# Patient Record
Sex: Female | Born: 1975 | Race: White | Hispanic: No | Marital: Married | Smoking: Current every day smoker
Health system: Southern US, Community
[De-identification: ages and names within clinical notes are randomized; demographics above are authoritative.]

## PROBLEM LIST (undated history)

## (undated) DIAGNOSIS — IMO0002 Reserved for concepts with insufficient information to code with codable children: Secondary | ICD-10-CM

## (undated) DIAGNOSIS — R51 Headache: Secondary | ICD-10-CM

## (undated) DIAGNOSIS — F1910 Other psychoactive substance abuse, uncomplicated: Secondary | ICD-10-CM

## (undated) DIAGNOSIS — J439 Emphysema, unspecified: Secondary | ICD-10-CM

## (undated) DIAGNOSIS — F329 Major depressive disorder, single episode, unspecified: Secondary | ICD-10-CM

## (undated) DIAGNOSIS — F32A Depression, unspecified: Secondary | ICD-10-CM

## (undated) DIAGNOSIS — I10 Essential (primary) hypertension: Secondary | ICD-10-CM

## (undated) DIAGNOSIS — K219 Gastro-esophageal reflux disease without esophagitis: Secondary | ICD-10-CM

## (undated) HISTORY — DX: Reserved for concepts with insufficient information to code with codable children: IMO0002

## (undated) HISTORY — DX: Major depressive disorder, single episode, unspecified: F32.9

## (undated) HISTORY — DX: Essential (primary) hypertension: I10

## (undated) HISTORY — DX: Gastro-esophageal reflux disease without esophagitis: K21.9

## (undated) HISTORY — PX: CHOLECYSTECTOMY: SHX55

## (undated) HISTORY — DX: Depression, unspecified: F32.A

## (undated) HISTORY — DX: Headache: R51

## (undated) HISTORY — DX: Emphysema, unspecified: J43.9

## (undated) HISTORY — DX: Other psychoactive substance abuse, uncomplicated: F19.10

## (undated) HISTORY — PX: TUBAL LIGATION: SHX77

---

## 2002-11-28 ENCOUNTER — Emergency Department (HOSPITAL_COMMUNITY): Admission: EM | Admit: 2002-11-28 | Discharge: 2002-11-28 | Payer: Self-pay | Admitting: Emergency Medicine

## 2003-11-02 ENCOUNTER — Emergency Department (HOSPITAL_COMMUNITY): Admission: AD | Admit: 2003-11-02 | Discharge: 2003-11-02 | Payer: Self-pay | Admitting: Emergency Medicine

## 2006-11-02 ENCOUNTER — Emergency Department (HOSPITAL_COMMUNITY): Admission: EM | Admit: 2006-11-02 | Discharge: 2006-11-03 | Payer: Self-pay | Admitting: Emergency Medicine

## 2008-10-29 ENCOUNTER — Emergency Department (HOSPITAL_COMMUNITY): Admission: EM | Admit: 2008-10-29 | Discharge: 2008-10-29 | Payer: Self-pay | Admitting: Emergency Medicine

## 2009-02-18 ENCOUNTER — Emergency Department (HOSPITAL_COMMUNITY): Admission: EM | Admit: 2009-02-18 | Discharge: 2009-02-19 | Payer: Self-pay | Admitting: Emergency Medicine

## 2009-03-18 ENCOUNTER — Emergency Department (HOSPITAL_COMMUNITY): Admission: EM | Admit: 2009-03-18 | Discharge: 2009-03-18 | Payer: Self-pay | Admitting: Emergency Medicine

## 2009-05-29 ENCOUNTER — Emergency Department (HOSPITAL_COMMUNITY): Admission: EM | Admit: 2009-05-29 | Discharge: 2009-05-29 | Payer: Self-pay | Admitting: Emergency Medicine

## 2009-11-14 ENCOUNTER — Ambulatory Visit: Payer: Self-pay | Admitting: Family Medicine

## 2010-03-03 ENCOUNTER — Emergency Department (HOSPITAL_COMMUNITY): Admission: EM | Admit: 2010-03-03 | Discharge: 2010-03-03 | Payer: Self-pay | Admitting: Emergency Medicine

## 2010-07-01 ENCOUNTER — Emergency Department (HOSPITAL_COMMUNITY)
Admission: EM | Admit: 2010-07-01 | Discharge: 2010-07-01 | Payer: Self-pay | Source: Home / Self Care | Admitting: Emergency Medicine

## 2010-08-29 IMAGING — CR DG ABDOMEN 1V
1 series · 1 of 1 positions shown · non-contrast
Comparison: None

CLINICAL DATA: Epigastric pain with nausea and vomiting

ABDOMEN - 1 VIEW

[view not recorded]
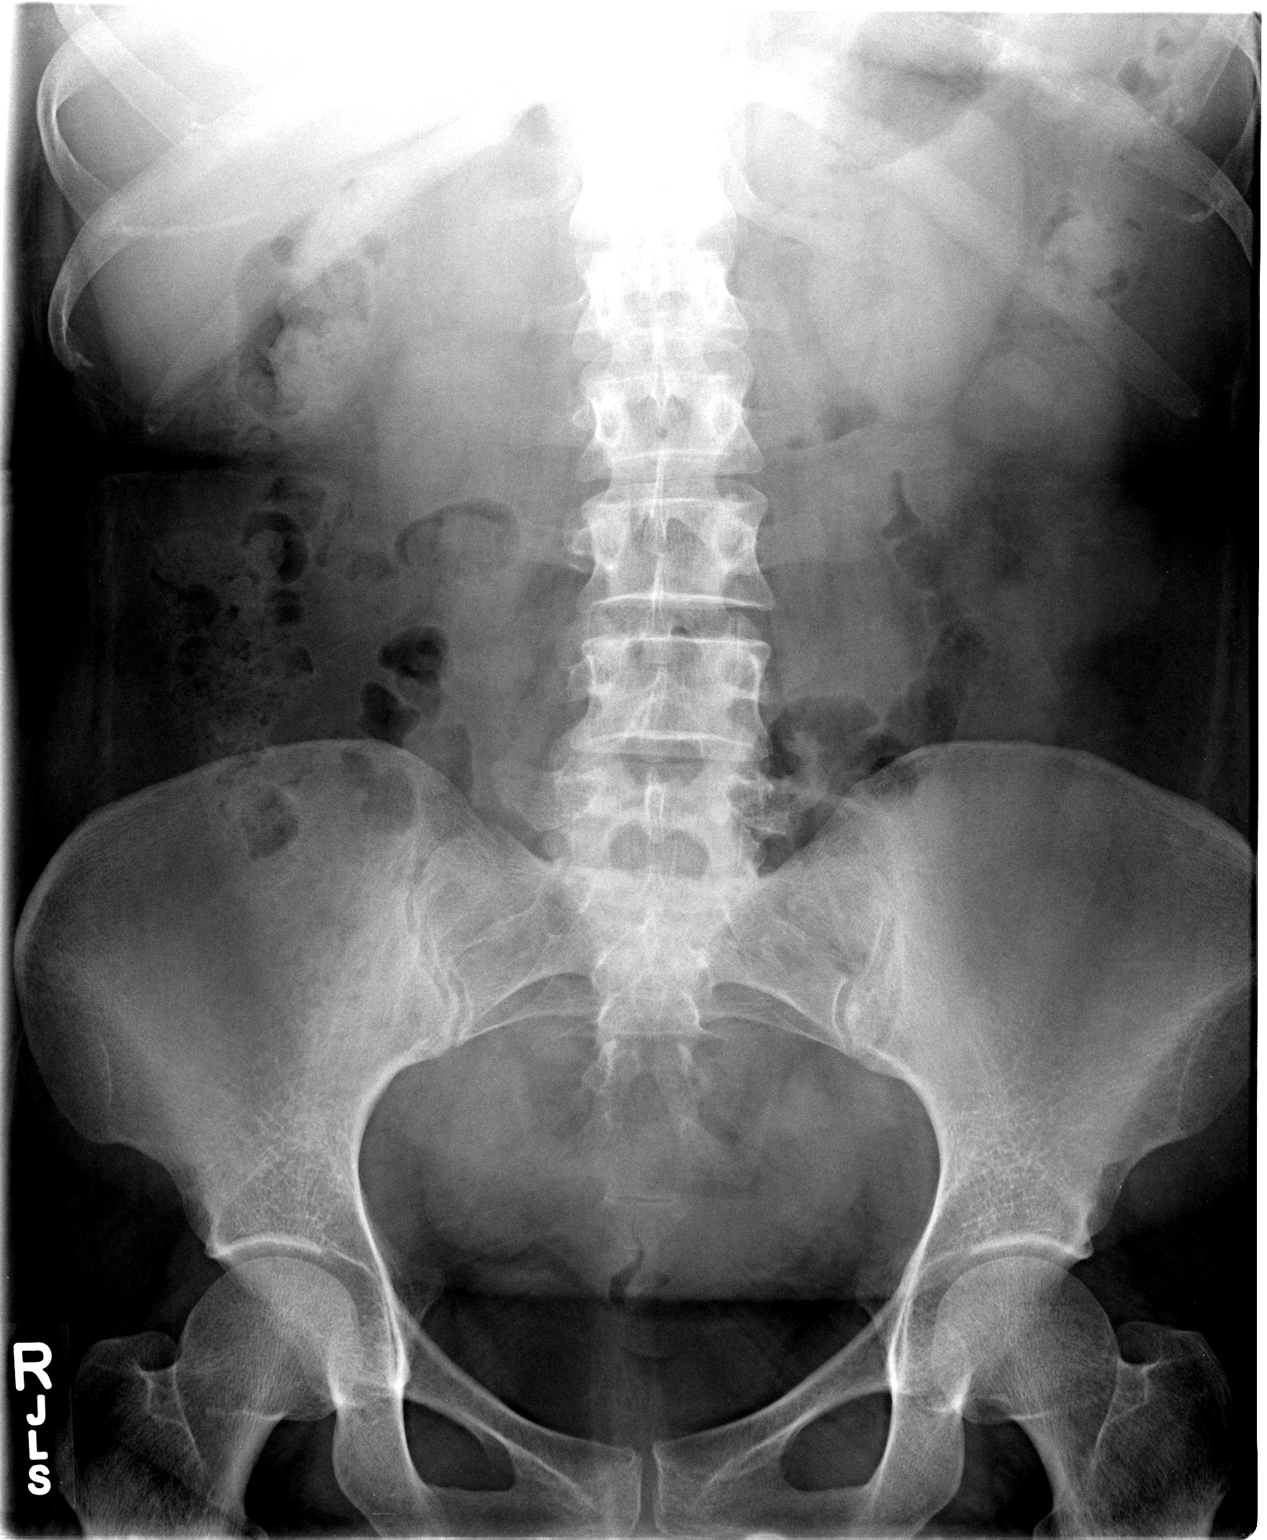

[1 of 1 positions shown; findings below may reference images not displayed]

FINDINGS: No acute or specific abnormality of the bowel gas
pattern.  Psoas margins intact.  No pathological calcifications or
osseous lesions.  Prior cholecystectomy.
IMPRESSION: No acute or significant findings.

## 2010-10-28 LAB — URINALYSIS, ROUTINE W REFLEX MICROSCOPIC
Hgb urine dipstick: NEGATIVE
Ketones, ur: NEGATIVE mg/dL
Protein, ur: NEGATIVE mg/dL
Urobilinogen, UA: 0.2 mg/dL (ref 0.0–1.0)

## 2010-11-20 LAB — CBC
HCT: 41.9 % (ref 36.0–46.0)
MCV: 94.5 fL (ref 78.0–100.0)
Platelets: 325 10*3/uL (ref 150–400)
RDW: 13 % (ref 11.5–15.5)
WBC: 13.1 10*3/uL — ABNORMAL HIGH (ref 4.0–10.5)

## 2010-11-20 LAB — POCT I-STAT, CHEM 8
BUN: 9 mg/dL (ref 6–23)
Calcium, Ion: 1.1 mmol/L — ABNORMAL LOW (ref 1.12–1.32)
HCT: 45 % (ref 36.0–46.0)
Hemoglobin: 15.3 g/dL — ABNORMAL HIGH (ref 12.0–15.0)
Sodium: 138 mEq/L (ref 135–145)
TCO2: 24 mmol/L (ref 0–100)

## 2010-11-20 LAB — DIFFERENTIAL
Basophils Absolute: 0.3 10*3/uL — ABNORMAL HIGH (ref 0.0–0.1)
Basophils Relative: 2 % — ABNORMAL HIGH (ref 0–1)
Eosinophils Absolute: 0.2 10*3/uL (ref 0.0–0.7)
Eosinophils Relative: 1 % (ref 0–5)
Lymphs Abs: 3.9 10*3/uL (ref 0.7–4.0)
Neutrophils Relative %: 61 % (ref 43–77)

## 2010-11-20 LAB — POCT CARDIAC MARKERS: Myoglobin, poc: 55 ng/mL (ref 12–200)

## 2010-11-20 LAB — D-DIMER, QUANTITATIVE: D-Dimer, Quant: 0.29 ug/mL-FEU (ref 0.00–0.48)

## 2010-11-22 LAB — POCT URINALYSIS DIP (DEVICE)
Hgb urine dipstick: NEGATIVE
Ketones, ur: NEGATIVE mg/dL
Protein, ur: NEGATIVE mg/dL
Specific Gravity, Urine: 1.01 (ref 1.005–1.030)
Urobilinogen, UA: 0.2 mg/dL (ref 0.0–1.0)

## 2010-11-22 LAB — POCT H PYLORI SCREEN: H. PYLORI SCREEN, POC: NEGATIVE

## 2010-11-22 LAB — TSH: TSH: 0.942 u[IU]/mL (ref 0.350–4.500)

## 2010-11-22 LAB — POCT PREGNANCY, URINE: Preg Test, Ur: NEGATIVE

## 2010-11-23 LAB — POCT PREGNANCY, URINE: Preg Test, Ur: NEGATIVE

## 2010-11-23 LAB — CBC
HCT: 42.2 % (ref 36.0–46.0)
Hemoglobin: 14.6 g/dL (ref 12.0–15.0)
MCV: 92.7 fL (ref 78.0–100.0)
WBC: 10.7 10*3/uL — ABNORMAL HIGH (ref 4.0–10.5)

## 2010-11-23 LAB — GC/CHLAMYDIA PROBE AMP, GENITAL: Chlamydia, DNA Probe: NEGATIVE

## 2010-11-23 LAB — URINALYSIS, ROUTINE W REFLEX MICROSCOPIC
Glucose, UA: NEGATIVE mg/dL
Ketones, ur: NEGATIVE mg/dL
Nitrite: NEGATIVE
Protein, ur: NEGATIVE mg/dL
pH: 6.5 (ref 5.0–8.0)

## 2010-11-23 LAB — LIPASE, BLOOD: Lipase: 26 U/L (ref 11–59)

## 2010-11-23 LAB — COMPREHENSIVE METABOLIC PANEL
Alkaline Phosphatase: 73 U/L (ref 39–117)
BUN: 11 mg/dL (ref 6–23)
Chloride: 102 mEq/L (ref 96–112)
Creatinine, Ser: 0.66 mg/dL (ref 0.4–1.2)
GFR calc non Af Amer: >60 mL/min (ref >60)
Glucose, Bld: 98 mg/dL (ref 70–99)
Potassium: 3.8 mEq/L (ref 3.5–5.1)
Total Bilirubin: 0.5 mg/dL (ref 0.3–1.2)

## 2010-11-23 LAB — WET PREP, GENITAL: Trich, Wet Prep: NONE SEEN

## 2010-11-23 LAB — DIFFERENTIAL
Basophils Absolute: 0 10*3/uL (ref 0.0–0.1)
Basophils Relative: 0 % (ref 0–1)
Lymphocytes Relative: 34 % (ref 12–46)
Neutro Abs: 6.2 10*3/uL (ref 1.7–7.7)
Neutrophils Relative %: 58 % (ref 43–77)

## 2010-11-27 ENCOUNTER — Emergency Department (HOSPITAL_COMMUNITY)
Admission: EM | Admit: 2010-11-27 | Discharge: 2010-11-27 | Disposition: A | Discharge status: Home / Self Care | Payer: Self-pay | Attending: Emergency Medicine | Admitting: Emergency Medicine

## 2010-11-27 ENCOUNTER — Emergency Department (HOSPITAL_COMMUNITY): Payer: Self-pay

## 2010-11-27 DIAGNOSIS — F3130 Bipolar disorder, current episode depressed, mild or moderate severity, unspecified: Secondary | ICD-10-CM | POA: Insufficient documentation

## 2010-11-27 DIAGNOSIS — J029 Acute pharyngitis, unspecified: Secondary | ICD-10-CM | POA: Insufficient documentation

## 2010-11-27 DIAGNOSIS — R059 Cough, unspecified: Secondary | ICD-10-CM | POA: Insufficient documentation

## 2010-11-27 DIAGNOSIS — R05 Cough: Secondary | ICD-10-CM | POA: Insufficient documentation

## 2010-11-27 DIAGNOSIS — J40 Bronchitis, not specified as acute or chronic: Secondary | ICD-10-CM | POA: Insufficient documentation

## 2010-12-11 ENCOUNTER — Encounter: Payer: Self-pay | Admitting: Internal Medicine

## 2010-12-11 ENCOUNTER — Ambulatory Visit (INDEPENDENT_AMBULATORY_CARE_PROVIDER_SITE_OTHER): Payer: Self-pay | Admitting: Internal Medicine

## 2010-12-11 ENCOUNTER — Other Ambulatory Visit (HOSPITAL_COMMUNITY)
Admission: RE | Admit: 2010-12-11 | Discharge: 2010-12-11 | Disposition: A | Discharge status: Home / Self Care | Payer: Self-pay | Source: Ambulatory Visit | Attending: Internal Medicine | Admitting: Internal Medicine

## 2010-12-11 DIAGNOSIS — R5381 Other malaise: Secondary | ICD-10-CM

## 2010-12-11 DIAGNOSIS — N949 Unspecified condition associated with female genital organs and menstrual cycle: Secondary | ICD-10-CM

## 2010-12-11 DIAGNOSIS — R32 Unspecified urinary incontinence: Secondary | ICD-10-CM

## 2010-12-11 DIAGNOSIS — Z0000 Encounter for general adult medical examination without abnormal findings: Secondary | ICD-10-CM

## 2010-12-11 DIAGNOSIS — R5383 Other fatigue: Secondary | ICD-10-CM | POA: Insufficient documentation

## 2010-12-11 DIAGNOSIS — R102 Pelvic and perineal pain: Secondary | ICD-10-CM

## 2010-12-11 DIAGNOSIS — Z01419 Encounter for gynecological examination (general) (routine) without abnormal findings: Secondary | ICD-10-CM | POA: Insufficient documentation

## 2010-12-11 LAB — CBC WITH DIFFERENTIAL/PLATELET
Basophils Absolute: 0 10*3/uL (ref 0.0–0.1)
Eosinophils Relative: 1.3 % (ref 0.0–5.0)
HCT: 41.1 % (ref 36.0–46.0)
Lymphocytes Relative: 39.2 % (ref 12.0–46.0)
Monocytes Relative: 7.8 % (ref 3.0–12.0)
Neutrophils Relative %: 51.2 % (ref 43.0–77.0)
Platelets: 279 10*3/uL (ref 150.0–400.0)
RDW: 13.4 % (ref 11.5–14.6)
WBC: 9 10*3/uL (ref 4.5–10.5)

## 2010-12-11 LAB — POCT URINALYSIS DIPSTICK
Ketones, UA: NEGATIVE
Leukocytes, UA: NEGATIVE
Nitrite, UA: NEGATIVE
Protein, UA: NEGATIVE
Urobilinogen, UA: 0.2

## 2010-12-11 LAB — BASIC METABOLIC PANEL
CO2: 25 mEq/L (ref 19–32)
Glucose, Bld: 89 mg/dL (ref 70–99)
Potassium: 4.5 mEq/L (ref 3.5–5.1)
Sodium: 138 mEq/L (ref 135–145)

## 2010-12-11 NOTE — Assessment & Plan Note (Signed)
Pelvic and pap performed. GC/chlamydia test sent. KOH/wet prep ordered but unable to be completed. Consider pelvic US if sx's persist.

## 2010-12-11 NOTE — Assessment & Plan Note (Signed)
Obtain UA. Handout provided for kegel exercises.

## 2010-12-11 NOTE — Assessment & Plan Note (Signed)
Obtain cbc, chem7, and tsh.

## 2010-12-11 NOTE — Progress Notes (Addendum)
  Subjective:    Patient ID: Tiffany Richmond, female    DOB: 1976-05-09, 36 y.o.   MRN: [id number]  HPI Pt presents to clinic for evaluation of multiple medical problems. Meds chronic fatigue without associated focal complaint. States has chronic intermittent vaginal yeast infections and is beginning over-the-counter medication now. Signs of chronic intermittent left greater than right bilateral inguinal pain. Notes also associated dyspareunia. Denies current vaginal discharge. Complains of a one-month history of stress incontinence occurring with coughing or sneezing. No hematuria dysuria or increased frequency. Uses tobacco but is reduced amount approximately 1/2 pack per day and is attempting cessation. Successfully stopped drug use approximately 5 years ago and has remained abstinent.  Reviewed past medical history, past surgical history, medications, allergies, social history and family history    Review of Systems  Constitutional: Positive for fatigue. Negative for fever and chills.  Gastrointestinal: Positive for abdominal pain.  Genitourinary: Positive for dyspareunia. Negative for dysuria and frequency.       Objective:   Physical Exam  Nursing note and vitals reviewed. Constitutional: She appears well-developed and well-nourished. No distress.  HENT:  Head: Normocephalic and atraumatic.  Right Ear: Tympanic membrane, external ear and ear canal normal.  Left Ear: Tympanic membrane, external ear and ear canal normal.  Nose: Nose normal.  Mouth/Throat: Oropharynx is clear and moist. No oropharyngeal exudate.  Eyes: Conjunctivae and EOM are normal. Pupils are equal, round, and reactive to light. Right eye exhibits no discharge. Left eye exhibits no discharge. No scleral icterus.  Neck: Neck supple. No JVD present. Carotid bruit is not present. No thyromegaly present.  Cardiovascular: Normal rate, regular rhythm and normal heart sounds.  Exam reveals no gallop and no friction rub.     No murmur heard. Pulmonary/Chest: Effort normal and breath sounds normal. No respiratory distress. She has no wheezes. She has no rales.  Abdominal: Soft. She exhibits no distension and no mass. There is tenderness. There is no rebound and no guarding.       Bilateral left>right lower abdominal to inguinal tenderness.  Lymphadenopathy:    She has no cervical adenopathy.  Neurological: She is alert.  Skin: Skin is warm and dry. She is not diaphoretic.  Psychiatric: She has a normal mood and affect.  Gyn: exam performed with female nurse escort. Ext genitalia nl. Vaginal mucosa nl. Cervice visualized with nl appearance. Pap smear obtained. Minimal clear discharge noted.         Assessment & Plan:

## 2010-12-12 ENCOUNTER — Telehealth: Payer: Self-pay

## 2010-12-12 LAB — GC/CHLAMYDIA PROBE AMP, GENITAL
Chlamydia, DNA Probe: NEGATIVE
GC Probe Amp, Genital: NEGATIVE

## 2010-12-12 NOTE — Telephone Encounter (Signed)
Pt aware.

## 2010-12-12 NOTE — Telephone Encounter (Signed)
Message copied by Kyung Rudd on Fri Dec 12, 2010  2:07 PM ------      Message from: Letitia Libra, Maisie Fus      Created: Fri Dec 12, 2010 12:38 PM       Labs nl

## 2010-12-15 ENCOUNTER — Telehealth: Payer: Self-pay

## 2010-12-15 NOTE — Telephone Encounter (Signed)
Message copied by Kyung Rudd on Mon Dec 15, 2010  3:00 PM ------      Message from: Letitia Libra, Maisie Fus      Created: Fri Dec 12, 2010  5:30 PM       Pap nl

## 2011-02-10 ENCOUNTER — Ambulatory Visit: Payer: Self-pay | Admitting: Internal Medicine

## 2011-02-13 ENCOUNTER — Telehealth: Payer: Self-pay | Admitting: Family

## 2011-02-13 NOTE — Telephone Encounter (Signed)
Opened in error

## 2011-10-16 ENCOUNTER — Encounter (HOSPITAL_COMMUNITY): Payer: Self-pay

## 2011-10-16 ENCOUNTER — Emergency Department (HOSPITAL_COMMUNITY): Payer: Self-pay

## 2011-10-16 ENCOUNTER — Emergency Department (HOSPITAL_COMMUNITY)
Admission: EM | Admit: 2011-10-16 | Discharge: 2011-10-16 | Disposition: A | Discharge status: Home / Self Care | Payer: Self-pay | Attending: Emergency Medicine | Admitting: Emergency Medicine

## 2011-10-16 DIAGNOSIS — R5381 Other malaise: Secondary | ICD-10-CM | POA: Insufficient documentation

## 2011-10-16 DIAGNOSIS — F329 Major depressive disorder, single episode, unspecified: Secondary | ICD-10-CM | POA: Insufficient documentation

## 2011-10-16 DIAGNOSIS — R509 Fever, unspecified: Secondary | ICD-10-CM | POA: Insufficient documentation

## 2011-10-16 DIAGNOSIS — F3289 Other specified depressive episodes: Secondary | ICD-10-CM | POA: Insufficient documentation

## 2011-10-16 DIAGNOSIS — R5383 Other fatigue: Secondary | ICD-10-CM | POA: Insufficient documentation

## 2011-10-16 DIAGNOSIS — H9209 Otalgia, unspecified ear: Secondary | ICD-10-CM | POA: Insufficient documentation

## 2011-10-16 DIAGNOSIS — R070 Pain in throat: Secondary | ICD-10-CM | POA: Insufficient documentation

## 2011-10-16 DIAGNOSIS — J438 Other emphysema: Secondary | ICD-10-CM | POA: Insufficient documentation

## 2011-10-16 DIAGNOSIS — F411 Generalized anxiety disorder: Secondary | ICD-10-CM | POA: Insufficient documentation

## 2011-10-16 DIAGNOSIS — Z79899 Other long term (current) drug therapy: Secondary | ICD-10-CM | POA: Insufficient documentation

## 2011-10-16 DIAGNOSIS — R059 Cough, unspecified: Secondary | ICD-10-CM | POA: Insufficient documentation

## 2011-10-16 DIAGNOSIS — J329 Chronic sinusitis, unspecified: Secondary | ICD-10-CM | POA: Insufficient documentation

## 2011-10-16 DIAGNOSIS — K5900 Constipation, unspecified: Secondary | ICD-10-CM | POA: Insufficient documentation

## 2011-10-16 DIAGNOSIS — R51 Headache: Secondary | ICD-10-CM | POA: Insufficient documentation

## 2011-10-16 DIAGNOSIS — K219 Gastro-esophageal reflux disease without esophagitis: Secondary | ICD-10-CM | POA: Insufficient documentation

## 2011-10-16 DIAGNOSIS — R112 Nausea with vomiting, unspecified: Secondary | ICD-10-CM | POA: Insufficient documentation

## 2011-10-16 DIAGNOSIS — J069 Acute upper respiratory infection, unspecified: Secondary | ICD-10-CM | POA: Insufficient documentation

## 2011-10-16 DIAGNOSIS — R05 Cough: Secondary | ICD-10-CM | POA: Insufficient documentation

## 2011-10-16 DIAGNOSIS — I10 Essential (primary) hypertension: Secondary | ICD-10-CM | POA: Insufficient documentation

## 2011-10-16 DIAGNOSIS — J3489 Other specified disorders of nose and nasal sinuses: Secondary | ICD-10-CM | POA: Insufficient documentation

## 2011-10-16 MED ORDER — DIPHENHYDRAMINE HCL 50 MG/ML IJ SOLN
25.0000 mg | Freq: Once | INTRAMUSCULAR | Status: AC
  Administered 2011-10-16: 25 mg via INTRAVENOUS
  Filled 2011-10-16: qty 1

## 2011-10-16 MED ORDER — LORATADINE-PSEUDOEPHEDRINE ER 10-240 MG PO TB24: 1.0000 | ORAL_TABLET | Freq: Every day | ORAL | Status: DC

## 2011-10-16 MED ORDER — SODIUM CHLORIDE 0.9 % IV BOLUS (SEPSIS)
1000.0000 mL | Freq: Once | INTRAVENOUS | Status: AC
  Administered 2011-10-16: 1000 mL via INTRAVENOUS

## 2011-10-16 MED ORDER — BENZONATATE 100 MG PO CAPS: 100.0000 mg | ORAL_CAPSULE | Freq: Three times a day (TID) | ORAL | Status: AC

## 2011-10-16 MED ORDER — KETOROLAC TROMETHAMINE 30 MG/ML IJ SOLN
30.0000 mg | Freq: Once | INTRAMUSCULAR | Status: AC
  Administered 2011-10-16: 30 mg via INTRAVENOUS
  Filled 2011-10-16: qty 1

## 2011-10-16 MED ORDER — DOXYCYCLINE HYCLATE 100 MG PO CAPS: 100.0000 mg | ORAL_CAPSULE | Freq: Two times a day (BID) | ORAL | Status: AC

## 2011-10-16 MED ORDER — METOCLOPRAMIDE HCL 5 MG/ML IJ SOLN
10.0000 mg | Freq: Once | INTRAMUSCULAR | Status: AC
  Administered 2011-10-16: 10 mg via INTRAVENOUS
  Filled 2011-10-16: qty 2

## 2011-10-16 MED ORDER — ALBUTEROL SULFATE HFA 108 (90 BASE) MCG/ACT IN AERS
2.0000 | INHALATION_SPRAY | RESPIRATORY_TRACT | Status: DC | PRN
  Administered 2011-10-16: 2 via RESPIRATORY_TRACT
  Filled 2011-10-16: qty 6.7

## 2011-10-16 NOTE — Discharge Instructions (Signed)

## 2011-10-16 NOTE — ED Provider Notes (Signed)
History     CSN: [id number]  Arrival date & time 10/16/11  1458   First MD Initiated Contact with Patient 10/16/11 1718      Chief Complaint  Patient presents with  . Cough    x 1.5 weeks  . Headache    eye pressure  . Emesis    constant nausea.  Denies diarrhea  . Fever    (Consider location/radiation/quality/duration/timing/severity/associated sxs/prior treatment) HPI Comments: Patient states she has increased anxiety regarding this illness because her father died of pneumonia on this date.  Patient is a 36 y.o. female presenting with cough. The history is provided by the patient. No language interpreter was used.  Cough This is a new problem. The current episode started more than 1 week ago. The problem occurs constantly. The problem has been gradually worsening. The cough is productive of sputum. Maximum temperature: subjective fever. Associated symptoms include ear pain, headaches, rhinorrhea and sore throat. Pertinent negatives include no chest pain, no chills, no ear congestion, no myalgias, no shortness of breath, no wheezing and no eye redness. She has tried nothing for the symptoms. She is a smoker.    Past Medical History  Diagnosis Date  . Substance abuse   . Depression   . Emphysema of lung   . Headache   . GERD (gastroesophageal reflux disease)   . Ulcer   . Hypertension     Past Surgical History  Procedure Date  . Cholecystectomy   . Tubal ligation     Family History  Problem Relation Age of Onset  . Mental illness Mother   . Arthritis Maternal Grandmother   . Hyperlipidemia Maternal Grandmother   . Hypertension Maternal Grandmother     History  Substance Use Topics  . Smoking status: Current Everyday Smoker -- 0.5 packs/day    Types: Cigarettes  . Smokeless tobacco: Not on file  . Alcohol Use: Yes     very seldom    OB History    Grav Para Term Preterm Abortions TAB SAB Ect Mult Living                  Review of Systems    Constitutional: Positive for fever (subjective) and fatigue. Negative for chills, activity change and appetite change.  HENT: Positive for ear pain, congestion, sore throat, rhinorrhea, postnasal drip and sinus pressure. Negative for trouble swallowing, neck pain and neck stiffness.   Eyes: Negative for redness.  Respiratory: Positive for cough. Negative for chest tightness, shortness of breath and wheezing.   Cardiovascular: Negative for chest pain and palpitations.  Gastrointestinal: Positive for nausea, vomiting and constipation. Negative for abdominal pain, diarrhea and blood in stool.  Genitourinary: Negative for dysuria, urgency, frequency and flank pain.  Musculoskeletal: Negative for myalgias, back pain and arthralgias.  Neurological: Positive for headaches. Negative for dizziness, weakness, light-headedness and numbness.  All other systems reviewed and are negative.    Allergies  Review of patient's allergies indicates no known allergies.  Home Medications   Current Outpatient Rx  Name Route Sig Dispense Refill  . ACETAMINOPHEN 325 MG PO TABS Oral Take 975 mg by mouth every 6 (six) hours as needed. For pain    . BENZONATATE 100 MG PO CAPS Oral Take 1 capsule (100 mg total) by mouth every 8 (eight) hours. 21 capsule 0  . DOXYCYCLINE HYCLATE 100 MG PO CAPS Oral Take 1 capsule (100 mg total) by mouth 2 (two) times daily. 20 capsule 0  . LORATADINE-PSEUDOEPHEDRINE ER 10-240  MG PO TB24 Oral Take 1 tablet by mouth daily. 5 tablet 0    BP 141/87  Pulse 85  Temp(Src) 98.3 F (36.8 C) (Oral)  Resp 18  Ht 5\' 1"  (1.549 m)  Wt 195 lb (88.451 kg)  BMI 36.84 kg/m2  SpO2 100%  LMP 10/02/2011  Physical Exam  Nursing note and vitals reviewed. Constitutional: She is oriented to person, place, and time. She appears well-developed and well-nourished. No distress.  HENT:  Head: Normocephalic and atraumatic.  Right Ear: External ear normal.  Left Ear: External ear normal.   Mouth/Throat: Oropharynx is clear and moist.       R maxillary tenderness  Eyes: Conjunctivae and EOM are normal. Pupils are equal, round, and reactive to light.  Neck: Normal range of motion. Neck supple.  Cardiovascular: Normal rate, regular rhythm, normal heart sounds and intact distal pulses.  Exam reveals no gallop and no friction rub.   No murmur heard. Pulmonary/Chest: Effort normal and breath sounds normal. No respiratory distress. She exhibits no tenderness.  Abdominal: Soft. Bowel sounds are normal. There is no tenderness.  Musculoskeletal: Normal range of motion. She exhibits no tenderness.  Neurological: She is oriented to person, place, and time. No cranial nerve deficit.  Skin: Skin is warm and dry. No rash noted.    ED Course  Procedures (including critical care time)  Labs Reviewed - No data to display Dg Chest 2 View  10/16/2011  *RADIOLOGY REPORT*  Clinical Data: Cough  CHEST - 2 VIEW  Comparison: Chest radiograph 4/ 07/2011  Findings: Normal mediastinum and cardiac silhouette.  Normal pulmonary  vasculature.  No evidence of effusion, infiltrate, or pneumothorax.  No acute bony abnormality.  IMPRESSION: No acute cardiopulmonary process.  Original Report Authenticated By: Genevive Bi, M.D.     1. URI (upper respiratory infection)   2. Sinusitis       MDM  No evidence of pneumonia on chest x-ray. She is provided an albuterol inhaler for cough. She does have some evidence of sinusitis on examination. Will be placed on doxycycline, Tessalon Perles. Instructed to followup with her primary care physician. Encouraged to continue aggressive oral hydration at home. Provided signs and symptoms for which to return        Dayton Bailiff, MD 10/16/11 1907

## 2012-02-09 ENCOUNTER — Emergency Department (HOSPITAL_COMMUNITY)
Admission: EM | Admit: 2012-02-09 | Discharge: 2012-02-09 | Disposition: A | Discharge status: Home / Self Care | Payer: Self-pay | Attending: Emergency Medicine | Admitting: Emergency Medicine

## 2012-02-09 ENCOUNTER — Encounter (HOSPITAL_COMMUNITY): Payer: Self-pay | Admitting: *Deleted

## 2012-02-09 DIAGNOSIS — K219 Gastro-esophageal reflux disease without esophagitis: Secondary | ICD-10-CM | POA: Insufficient documentation

## 2012-02-09 DIAGNOSIS — K029 Dental caries, unspecified: Secondary | ICD-10-CM | POA: Insufficient documentation

## 2012-02-09 DIAGNOSIS — F17200 Nicotine dependence, unspecified, uncomplicated: Secondary | ICD-10-CM | POA: Insufficient documentation

## 2012-02-09 DIAGNOSIS — K0889 Other specified disorders of teeth and supporting structures: Secondary | ICD-10-CM

## 2012-02-09 DIAGNOSIS — J438 Other emphysema: Secondary | ICD-10-CM | POA: Insufficient documentation

## 2012-02-09 DIAGNOSIS — I10 Essential (primary) hypertension: Secondary | ICD-10-CM | POA: Insufficient documentation

## 2012-02-09 MED ORDER — OXYCODONE-ACETAMINOPHEN 5-325 MG PO TABS: 2.0000 | ORAL_TABLET | ORAL | Status: AC | PRN

## 2012-02-09 MED ORDER — PENICILLIN V POTASSIUM 500 MG PO TABS: 500.0000 mg | ORAL_TABLET | Freq: Four times a day (QID) | ORAL | Status: AC

## 2012-02-09 NOTE — Progress Notes (Signed)
Patient states she cannot afford medications. Per pharmacy she is eligible for [employer]. However, the prescribed antibiotic (Pcn-Pot 500mg ) is free of charge at [facility]. In addition, the percocet dose will cost her $16.00 plus a 10% discount if she has a VIC card. I have relayed this information to [employer].

## 2012-02-09 NOTE — ED Notes (Signed)
Patient with right sided dental pain, patient states pressure in head also, patient states now with the pain unable to eat and drink

## 2012-02-09 NOTE — Discharge Instructions (Signed)
Dental Caries  Tooth decay (dental caries, cavities) is the most common of all oral diseases. It occurs in all ages but is more common in children and young adults.  CAUSES  Bacteria in your mouth combine with foods (particularly sugars and starches) to produce plaque. Plaque is a substance that sticks to the hard surfaces of teeth. The bacteria in the plaque produce acids that attack the enamel of teeth. Repeated acid attacks dissolve the enamel and create holes in the teeth. Root surfaces of teeth may also get these holes.  Other contributing factors include:   Frequent snacking and drinking of cavity-producing foods and liquids.   Poor oral hygiene.   Dry mouth.   Substance abuse such as methamphetamine.   Broken or poor fitting dental restorations.   Eating disorders.   Gastroesophageal reflux disease (GERD).   Certain radiation treatments to the head and neck.  SYMPTOMS  At first, dental decay appears as white, chalky areas on the enamel. In this early stage, symptoms are seldom present. As the decay progresses, pits and holes may appear on the enamel surfaces. Progression of the decay will lead to softening of the hard layers of the tooth. At this point you may experience some pain or achy feeling after sweet, hot, or cold foods or drinks are consumed. If left untreated, the decay will reach the internal structures of the tooth and produce severe pain. Extensive dental treatment, such as root canal therapy, may be needed to save the tooth at this late stage of decay development.  DIAGNOSIS  Most cavities will be detected during regular check-ups. A thorough medical and dental history will be taken by the dentist. The dentist will use instruments to check the surfaces of your teeth for any breakdown or discoloration. Some dentists have special instruments, such as lasers, that detect tooth decay. Dental X-rays may also show some cavities that are not visible to the eye (such as between  the contact areas of the teeth). TREATMENT  Treatment involves removal of the tooth decay and replacement with a restorative material such as silver, gold, or composite (white) material. However, if the decay involves a large area of the tooth and there is little remaining healthy tooth structure, a cap (crown) will be fitted over the remaining structure. If the decay involves the center part of the tooth (pulp), root canal treatment will be needed before any type of dental restoration is placed. If the tooth is severely destroyed by the decay process, leaving the remaining tooth structures unrestorable, the tooth will need to be pulled (extracted). Some early tooth decay may be reversed by fluoride treatments and thorough brushing and flossing at home. PREVENTION   Eat healthy foods. Restrict the amount of sugary, starchy foods and liquids you consume. Avoid frequent snacking and drinking of unhealthy foods and liquids.   Sealants can help with prevention of cavities. Sealants are composite resins applied onto the biting surfaces of teeth at risk for decay. They smooth out the pits and grooves and prevent food from being trapped in them. This is done in early childhood before tooth decay has started.   Fluoride tablets may also be prescribed to children between 6 months and 10 years of age if your drinking water is not fluoridated. The fluoride absorbed by the tooth enamel makes teeth less susceptible to decay. Thorough daily cleaning with a toothbrush and dental floss is the best way to prevent cavities. Use of a fluoride toothpaste is highly recommended. Fluoride mouth rinses   may be used in specific cases.   Topical application of fluoride by your dentist is important in children.   Regular visits with a dentist for checkups and cleanings are also important.  SEEK IMMEDIATE DENTAL CARE IF:  You have a fever.   You develop redness and swelling of your face, jaw, or neck.   You develop swelling  around a tooth.   You are unable to open your mouth or cannot swallow.   You have severe pain uncontrolled by pain medicine.  Document Released: 04/25/2002 Document Revised: 07/23/2011 Document Reviewed: 01/08/2011 [employer] Patient Information 2012 [employer]. 

## 2012-02-09 NOTE — ED Provider Notes (Signed)
History   This chart was scribed for Tiffany Shi, MD by Sofie Rower. The patient was seen in room TR02C/TR02C and the patient's care was started at 2:45 PM     CSN: [id number]  Arrival date & time 02/09/12  1257   None     Chief Complaint  Patient presents with  . Dental Pain    (Consider location/radiation/quality/duration/timing/severity/associated sxs/prior treatment) Patient is a 36 y.o. female presenting with tooth pain. The history is provided by the patient. No language interpreter was used.  Dental PainThe primary symptoms include mouth pain. The symptoms are unchanged. The symptoms are new. The symptoms occur constantly.  Mouth pain occurs constantly. Mouth pain is unchanged. Affected locations include: teeth.  Additional symptoms include: dental sensitivity to temperature.    Tiffany Richmond is a 36 y.o. female who presents to the Emergency Department complaining of moderate, episodic dental pain located on the right side onset one month ago with associated symptoms of loss of sleep. Modifying factors include eating or drinking which intensifies the Pain, taking alieve (2) which does not provide any relief.  Pt has a hx of wisdom teeth being pulled (2 years ago).  Pt does not have insurance. Pt is employed two days a week at present.      Past Medical History  Diagnosis Date  . Substance abuse   . Depression   . Emphysema of lung   . Headache   . GERD (gastroesophageal reflux disease)   . Ulcer   . Hypertension     Past Surgical History  Procedure Date  . Cholecystectomy   . Tubal ligation     Family History  Problem Relation Age of Onset  . Mental illness Mother   . Arthritis Maternal Grandmother   . Hyperlipidemia Maternal Grandmother   . Hypertension Maternal Grandmother     History  Substance Use Topics  . Smoking status: Current Everyday Smoker -- 0.5 packs/day    Types: Cigarettes  . Smokeless tobacco: Not on file  . Alcohol Use: Yes    very seldom    OB History    Grav Para Term Preterm Abortions TAB SAB Ect Mult Living                  Review of Systems  All other systems reviewed and are negative.    Allergies  Review of patient's allergies indicates no known allergies.  Home Medications   Current Outpatient Rx  Name Route Sig Dispense Refill  . ACETAMINOPHEN 325 MG PO TABS Oral Take 325-650 mg by mouth every 6 (six) hours as needed. For pain    . GOODY HEADACHE PO Oral Take 1 packet by mouth 4 (four) times daily as needed. For pain    . OVER THE COUNTER MEDICATION Oral Take 3 tablets by mouth at bedtime as needed. For pain and sleep    . OXYCODONE-ACETAMINOPHEN 5-325 MG PO TABS Oral Take 2 tablets by mouth every 4 (four) hours as needed for pain. 6 tablet 0  . PENICILLIN V POTASSIUM 500 MG PO TABS Oral Take 1 tablet (500 mg total) by mouth 4 (four) times daily. 30 tablet 0    BP 157/90  Pulse 82  Temp 98 F (36.7 C) (Oral)  Resp 20  SpO2 99%  LMP 01/29/2012  Physical Exam  Nursing note and vitals reviewed. Constitutional: She is oriented to person, place, and time. She appears well-developed and well-nourished. No distress.  HENT:  Head: Normocephalic and atraumatic.  Several cavities (2-3) noted.   Eyes: Pupils are equal, round, and reactive to light.  Neck: Normal range of motion.  Cardiovascular: Normal rate and intact distal pulses.   Pulmonary/Chest: No respiratory distress.  Abdominal: Normal appearance. She exhibits no distension.  Musculoskeletal: Normal range of motion.  Neurological: She is alert and oriented to person, place, and time. No cranial nerve deficit.  Skin: Skin is warm and dry. No rash noted.  Psychiatric: She has a normal mood and affect. Her behavior is normal.    ED Course  Procedures (including critical care time)  DIAGNOSTIC STUDIES: Oxygen Saturation is 99% on room air, normal by my interpretation.    COORDINATION OF CARE:  3:13PM- EDP at bedside  discusses treatment plan concerning follow up with a Dentist, pain management, antibiotics.    Labs Reviewed - No data to display No results found.   1. Pain, dental   2. Tooth decay       MDM        I personally performed the services described in this documentation, which was scribed in my presence. The recorded information has been reviewed and considered.     Tiffany Shi, MD 02/09/12 1520

## 2012-04-20 ENCOUNTER — Emergency Department (HOSPITAL_COMMUNITY)
Admission: EM | Admit: 2012-04-20 | Discharge: 2012-04-20 | Disposition: A | Discharge status: Home / Self Care | Payer: Self-pay | Attending: Emergency Medicine | Admitting: Emergency Medicine

## 2012-04-20 ENCOUNTER — Encounter (HOSPITAL_COMMUNITY): Payer: Self-pay | Admitting: Emergency Medicine

## 2012-04-20 DIAGNOSIS — K029 Dental caries, unspecified: Secondary | ICD-10-CM | POA: Insufficient documentation

## 2012-04-20 DIAGNOSIS — I10 Essential (primary) hypertension: Secondary | ICD-10-CM | POA: Insufficient documentation

## 2012-04-20 DIAGNOSIS — K0889 Other specified disorders of teeth and supporting structures: Secondary | ICD-10-CM

## 2012-04-20 DIAGNOSIS — J438 Other emphysema: Secondary | ICD-10-CM | POA: Insufficient documentation

## 2012-04-20 DIAGNOSIS — K219 Gastro-esophageal reflux disease without esophagitis: Secondary | ICD-10-CM | POA: Insufficient documentation

## 2012-04-20 DIAGNOSIS — F17200 Nicotine dependence, unspecified, uncomplicated: Secondary | ICD-10-CM | POA: Insufficient documentation

## 2012-04-20 MED ORDER — OXYCODONE-ACETAMINOPHEN 5-325 MG PO TABS: 1.0000 | ORAL_TABLET | ORAL | Status: AC | PRN

## 2012-04-20 MED ORDER — OXYCODONE-ACETAMINOPHEN 5-325 MG PO TABS
2.0000 | ORAL_TABLET | Freq: Once | ORAL | Status: AC
  Administered 2012-04-20: 2 via ORAL
  Filled 2012-04-20: qty 2

## 2012-04-20 MED ORDER — BUPIVACAINE HCL (PF) 0.5 % IJ SOLN
20.0000 mL | Freq: Once | INTRAMUSCULAR | Status: DC
  Filled 2012-04-20: qty 10

## 2012-04-20 MED ORDER — PENICILLIN V POTASSIUM 500 MG PO TABS: 500.0000 mg | ORAL_TABLET | Freq: Four times a day (QID) | ORAL | Status: AC

## 2012-04-20 MED ORDER — BUPIVACAINE HCL 0.5 % IJ SOLN
15.0000 mL | Freq: Once | INTRAMUSCULAR | Status: DC
  Filled 2012-04-20: qty 15

## 2012-04-20 NOTE — ED Notes (Signed)
PT. REPORTS LEFT UPPER MOLAR PAIN FOR SEVERAL MONTHS WORSE THIS MORNING.

## 2012-04-20 NOTE — ED Provider Notes (Signed)
History     CSN: [id number]  Arrival date & time 04/20/12  4098   First MD Initiated Contact with Patient 04/20/12 [phone]      Chief Complaint  Patient presents with  . Dental Pain    (Consider location/radiation/quality/duration/timing/severity/associated sxs/prior treatment) Patient is a 36 y.o. female presenting with tooth pain. The history is provided by the patient. No language interpreter was used.  Dental PainThe primary symptoms include mouth pain and headaches. Primary symptoms do not include dental injury, oral bleeding, oral lesions, fever, shortness of breath, sore throat, angioedema or cough. The symptoms began 3 to 5 days ago. The symptoms are worsening. The symptoms are recurrent. The symptoms occur constantly.  At its highest the mouth pain was at 10/10. The mouth pain is currently at 10/10.  Additional symptoms include: dental sensitivity to temperature, jaw pain, ear pain and swollen glands. Additional symptoms do not include: gum swelling, gum tenderness, purulent gums, trismus, facial swelling, trouble swallowing, pain with swallowing, excessive salivation, dry mouth, taste disturbance, smell disturbance, drooling, hearing loss, nosebleeds, goiter and fatigue. Medical issues include: smoking.    Past Medical History  Diagnosis Date  . Substance abuse   . Depression   . Emphysema of lung   . Headache   . GERD (gastroesophageal reflux disease)   . Ulcer   . Hypertension     Past Surgical History  Procedure Date  . Cholecystectomy   . Tubal ligation     Family History  Problem Relation Age of Onset  . Mental illness Mother   . Arthritis Maternal Grandmother   . Hyperlipidemia Maternal Grandmother   . Hypertension Maternal Grandmother     History  Substance Use Topics  . Smoking status: Current Everyday Smoker -- 0.5 packs/day    Types: Cigarettes  . Smokeless tobacco: Not on file  . Alcohol Use: Yes     very seldom    OB History    Grav Para Term  Preterm Abortions TAB SAB Ect Mult Living                  Review of Systems  Constitutional: Negative for fever and fatigue.  HENT: Positive for ear pain. Negative for hearing loss, nosebleeds, sore throat, facial swelling, drooling and trouble swallowing.   Respiratory: Negative for cough and shortness of breath.   Neurological: Positive for headaches.    Allergies  Review of patient's allergies indicates no known allergies.  Home Medications   Current Outpatient Rx  Name Route Sig Dispense Refill  . ACETAMINOPHEN 325 MG PO TABS Oral Take 325-650 mg by mouth every 6 (six) hours as needed. For pain    . ALBUTEROL SULFATE HFA 108 (90 BASE) MCG/ACT IN AERS Inhalation Inhale 2 puffs into the lungs every 6 (six) hours as needed. For shortness of breath    . GOODY HEADACHE PO Oral Take 1 packet by mouth 4 (four) times daily as needed. For pain    . OVER THE COUNTER MEDICATION Oral Take 3 tablets by mouth at bedtime as needed. For pain and sleep    . OXYCODONE-ACETAMINOPHEN 5-325 MG PO TABS Oral Take 1-2 tablets by mouth every 4 (four) hours as needed for pain. 12 tablet 0  . PENICILLIN V POTASSIUM 500 MG PO TABS Oral Take 1 tablet (500 mg total) by mouth 4 (four) times daily. 40 tablet 0    BP 157/96  Pulse 84  Temp 97.7 F (36.5 C) (Oral)  Resp 20  SpO2 99%  LMP 04/17/2012  Physical Exam  Constitutional: She is oriented to person, place, and time. She appears well-developed and well-nourished. No distress.  HENT:  Head: Normocephalic and atraumatic. No trismus in the jaw.  Mouth/Throat: No oral lesions. Dental caries present. No dental abscesses, uvula swelling or lacerations.    Eyes: Conjunctivae are normal. No scleral icterus.  Neck: Normal range of motion.  Cardiovascular: Normal rate, regular rhythm and normal heart sounds.  Exam reveals no gallop and no friction rub.   No murmur heard. Pulmonary/Chest: Effort normal and breath sounds normal. No respiratory distress.    Abdominal: Soft. Bowel sounds are normal. She exhibits no distension and no mass. There is no tenderness. There is no guarding.  Musculoskeletal: Normal range of motion.  Neurological: She is alert and oriented to person, place, and time.  Skin: Skin is warm and dry. She is not diaphoretic.    ED Course  NERVE BLOCK Performed by: Arthor Captain Authorized by: Arthor Captain Consent: Verbal consent obtained. Risks and benefits: risks, benefits and alternatives were discussed Consent given by: patient Patient understanding: patient states understanding of the procedure being performed Indications: pain relief Nerve block body site: Dental block tooth 15. Needle gauge: 25 G Local anesthetic: bupivacaine 0.5% with epinephrine Anesthetic total: 0.5 ml Outcome: pain improved Patient tolerance: Patient tolerated the procedure well with no immediate complications.   (including critical care time)  Labs Reviewed - No data to display No results found.  Patient with pain in tooth 15. She has a large cheery in the posterior side of the tooth. There is no evidence of abscess. No gingival erythema. No areas of fluctuance. No signs of Ludwig angina. She does have  Left cervical tonsillar adenopathy. As TM is normal.sociated pain in jaw and the left ear. Performed dental block of the tooth with immediate relief to pain. She is going to followup with Dr. Burgess Estelle. Discharging the patient with Percocet and Pen-Vee K.   1. Pain, dental       MDM  Patient is safe for discharge at this time.Discussed reasons to seek immediate care. Patient expresses understanding and agrees with plan.         Arthor Captain, PA-C 04/20/12 [phone]

## 2012-04-20 NOTE — ED Notes (Signed)
Pt to ED c/o R upper molar pain for several months, that has increased w/in the last few days to where she is pacing the floor.  She has taken acet and goody's pain with no relief.  No oral swelling or lymph node swelling.

## 2012-04-20 NOTE — ED Notes (Signed)
Pt requested a HFA ventolin upon discharge. ED PA made aware and declined script at this time. Pt counseled on effects on respiratory and dental pain.

## 2012-04-20 NOTE — ED Notes (Signed)
PA at bedside.

## 2012-04-22 NOTE — ED Provider Notes (Signed)
Medical screening examination/treatment/procedure(s) were performed by non-physician practitioner and as supervising physician I was immediately available for consultation/collaboration.   Seerat Peaden III, MD 04/22/12 1800 

## 2013-03-28 IMAGING — CR DG CHEST 2V
2 series · 2 of 2 positions shown · non-contrast
Comparison: Chest radiograph [DATE]

CLINICAL DATA: Cough

CHEST - 2 VIEW

[w chest pa]
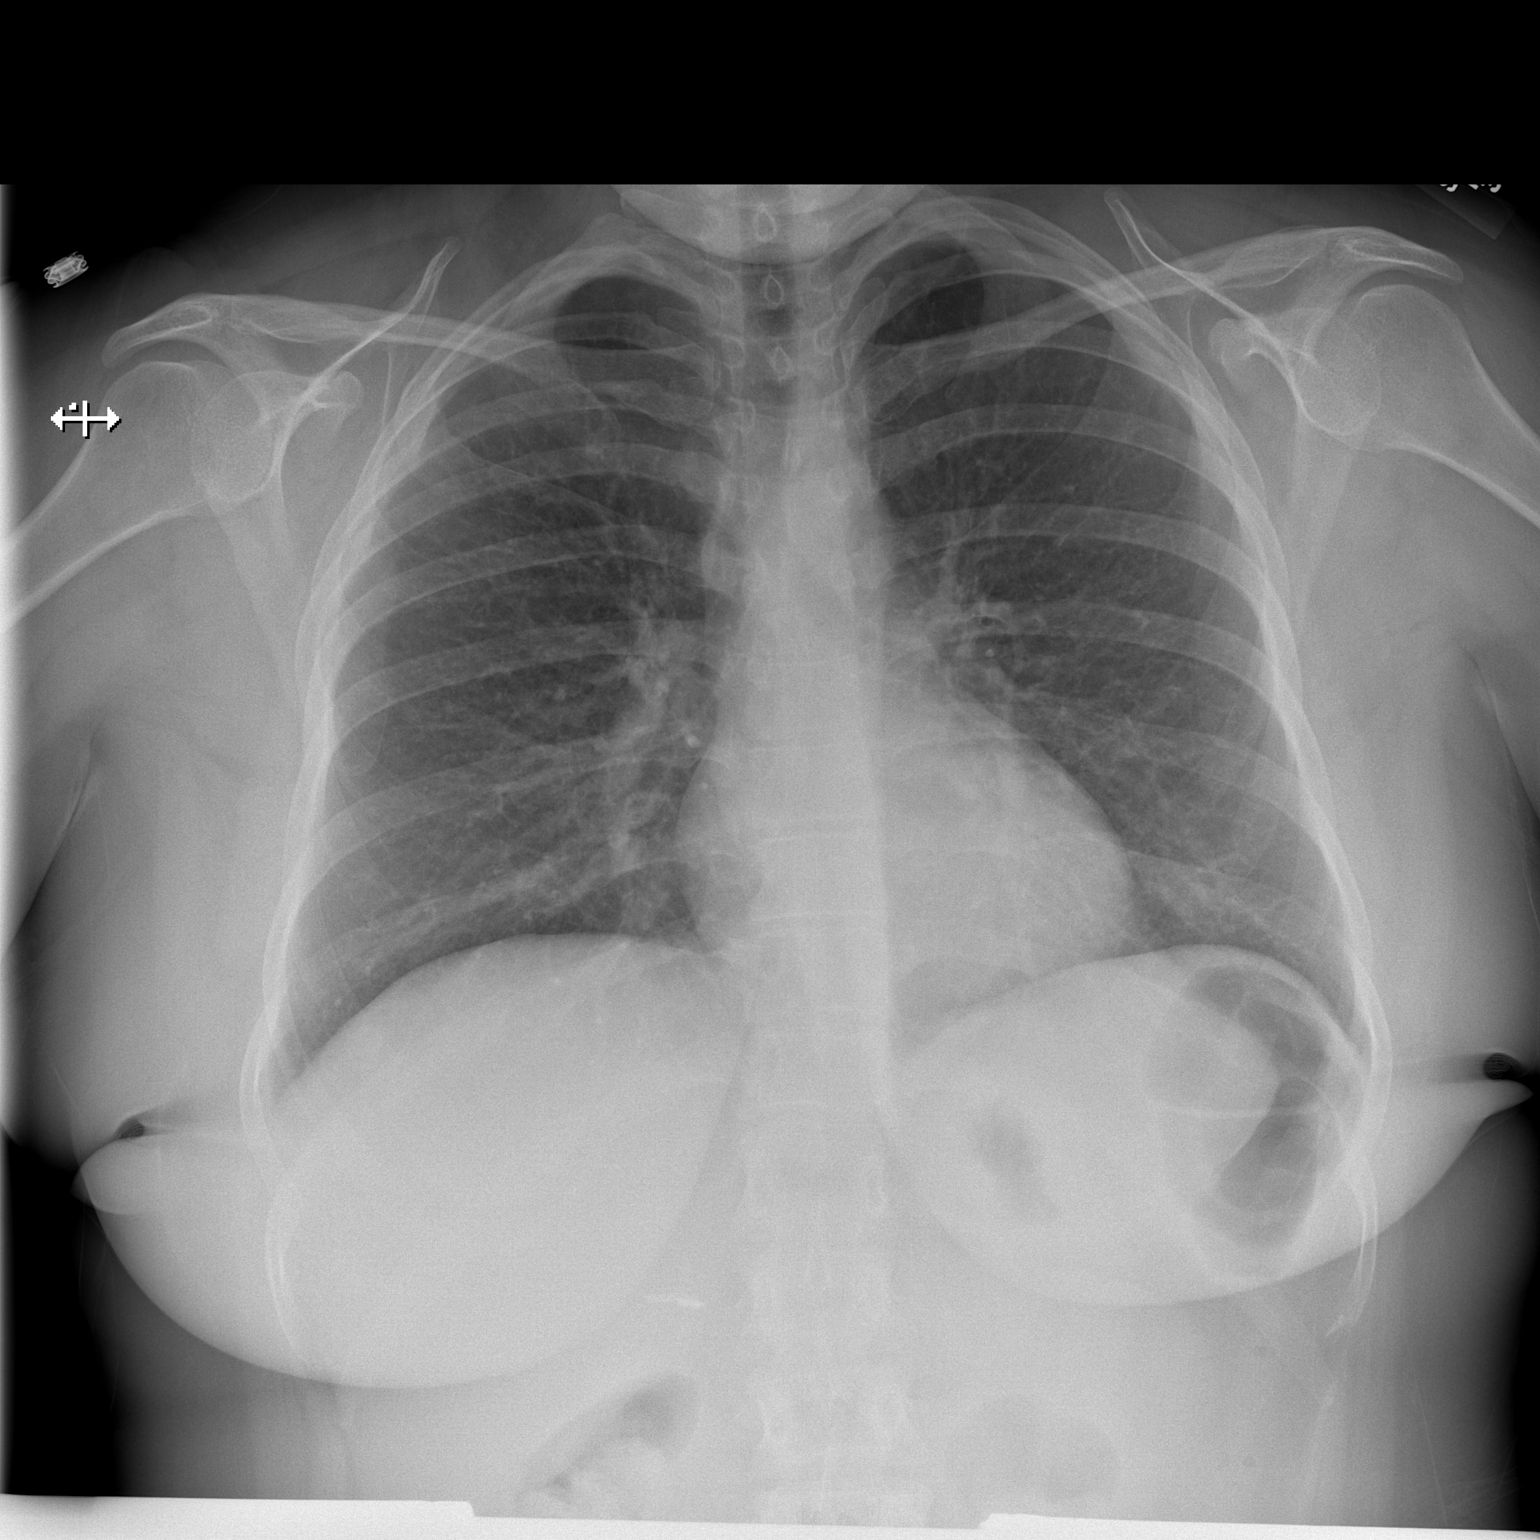

[w chest lat]
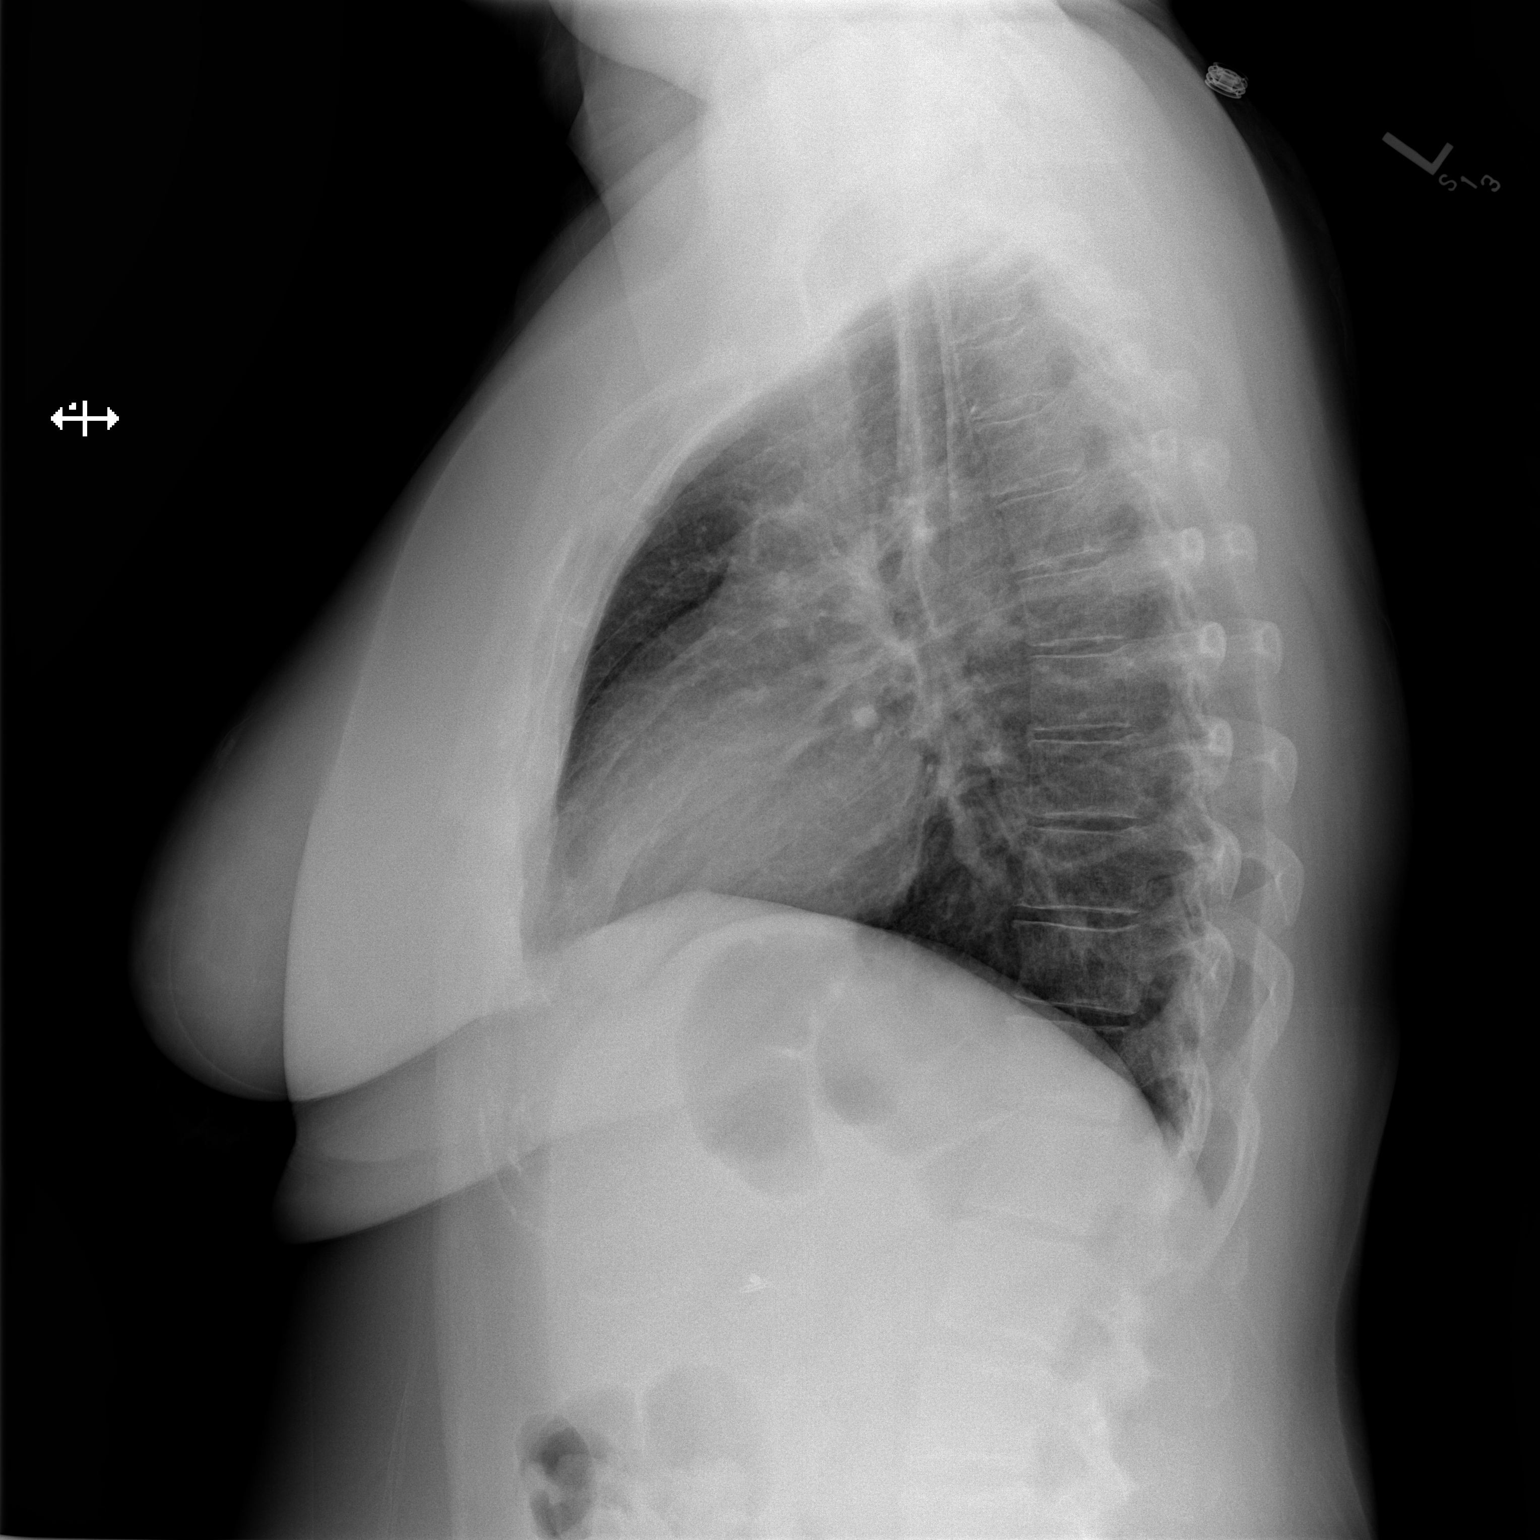

[2 of 2 positions shown; findings below may reference images not displayed]

FINDINGS: Normal mediastinum and cardiac silhouette.  Normal
pulmonary  vasculature.  No evidence of effusion, infiltrate, or
pneumothorax.  No acute bony abnormality.
IMPRESSION: No acute cardiopulmonary process.

## 2013-06-01 ENCOUNTER — Encounter (HOSPITAL_COMMUNITY): Payer: Self-pay | Admitting: Emergency Medicine

## 2013-06-01 ENCOUNTER — Emergency Department (HOSPITAL_COMMUNITY)
Admission: EM | Admit: 2013-06-01 | Discharge: 2013-06-01 | Disposition: A | Discharge status: Home / Self Care | Payer: Self-pay | Attending: Emergency Medicine | Admitting: Emergency Medicine

## 2013-06-01 DIAGNOSIS — K0889 Other specified disorders of teeth and supporting structures: Secondary | ICD-10-CM

## 2013-06-01 DIAGNOSIS — Z8659 Personal history of other mental and behavioral disorders: Secondary | ICD-10-CM | POA: Insufficient documentation

## 2013-06-01 DIAGNOSIS — K089 Disorder of teeth and supporting structures, unspecified: Secondary | ICD-10-CM | POA: Insufficient documentation

## 2013-06-01 DIAGNOSIS — I10 Essential (primary) hypertension: Secondary | ICD-10-CM | POA: Insufficient documentation

## 2013-06-01 DIAGNOSIS — Z8719 Personal history of other diseases of the digestive system: Secondary | ICD-10-CM | POA: Insufficient documentation

## 2013-06-01 DIAGNOSIS — Z8709 Personal history of other diseases of the respiratory system: Secondary | ICD-10-CM | POA: Insufficient documentation

## 2013-06-01 DIAGNOSIS — Z8711 Personal history of peptic ulcer disease: Secondary | ICD-10-CM | POA: Insufficient documentation

## 2013-06-01 DIAGNOSIS — F17200 Nicotine dependence, unspecified, uncomplicated: Secondary | ICD-10-CM | POA: Insufficient documentation

## 2013-06-01 MED ORDER — TRAMADOL HCL 50 MG PO TABS
50.0000 mg | ORAL_TABLET | Freq: Once | ORAL | Status: AC
  Administered 2013-06-01: 50 mg via ORAL
  Filled 2013-06-01: qty 1

## 2013-06-01 MED ORDER — PENICILLIN V POTASSIUM 500 MG PO TABS: 500.0000 mg | ORAL_TABLET | Freq: Four times a day (QID) | ORAL | Status: DC

## 2013-06-01 MED ORDER — TRAMADOL HCL 50 MG PO TABS: 50.0000 mg | ORAL_TABLET | Freq: Four times a day (QID) | ORAL | Status: DC | PRN

## 2013-06-01 MED ORDER — PENICILLIN V POTASSIUM 250 MG PO TABS
500.0000 mg | ORAL_TABLET | Freq: Four times a day (QID) | ORAL | Status: DC
  Administered 2013-06-01: 500 mg via ORAL
  Filled 2013-06-01: qty 2

## 2013-06-01 NOTE — ED Notes (Signed)
Wisdom teeth pulled 3 years. Exp. Pain from front upper teeth to back, where previous wisdom tooth was. Feels like "sharp, spasms" front upper teeth. Pain radiating up mid face. The third upper tooth on rt. Side is chipped. Take some stanback and goody powder for pain, no relief.

## 2013-06-01 NOTE — ED Provider Notes (Signed)
Medical screening examination/treatment/procedure(s) were performed by non-physician practitioner and as supervising physician I was immediately available for consultation/collaboration.    Vida Roller, MD 06/02/13 0000

## 2013-06-01 NOTE — ED Provider Notes (Signed)
CSN: [id number]     Arrival date & time 06/01/13  1911 History   First MD Initiated Contact with Patient 06/01/13 1930     No chief complaint on file.  (Consider location/radiation/quality/duration/timing/severity/associated sxs/prior Treatment) HPI Comments: The patient is a 37 year old otherwise healthy female who presents with dental pain that started gradually one month ago. The dental pain is severe, constant and progressively worsening. The pain is aching and located in upper jaw. The pain does not radiate. Eating makes the pain worse. Nothing makes the pain better. The patient has not tried anything for pain. No associated symptoms. Patient denies headache, neck pain/stiffness, fever, NVD, edema, sore throat, throat swelling, wheezing, SOB, chest pain, abdominal pain.      Past Medical History  Diagnosis Date  . Substance abuse   . Depression   . Emphysema of lung   . Headache(784.0)   . GERD (gastroesophageal reflux disease)   . Ulcer   . Hypertension    Past Surgical History  Procedure Laterality Date  . Cholecystectomy    . Tubal ligation     Family History  Problem Relation Age of Onset  . Mental illness Mother   . Arthritis Maternal Grandmother   . Hyperlipidemia Maternal Grandmother   . Hypertension Maternal Grandmother    History  Substance Use Topics  . Smoking status: Current Every Day Smoker -- 0.50 packs/day    Types: Cigarettes  . Smokeless tobacco: Not on file  . Alcohol Use: Yes     Comment: very seldom   OB History   Grav Para Term Preterm Abortions TAB SAB Ect Mult Living                 Review of Systems  HENT: Positive for dental problem.   All other systems reviewed and are negative.    Allergies  Review of patient's allergies indicates no known allergies.  Home Medications   Current Outpatient Rx  Name  Route  Sig  Dispense  Refill  . acetaminophen (TYLENOL) 325 MG tablet   Oral   Take 325-650 mg by mouth every 6 (six) hours as  needed. For pain         . albuterol (PROVENTIL HFA;VENTOLIN HFA) 108 (90 BASE) MCG/ACT inhaler   Inhalation   Inhale 2 puffs into the lungs every 6 (six) hours as needed. For shortness of breath         . Aspirin-Acetaminophen-Caffeine (GOODY HEADACHE PO)   Oral   Take 1 packet by mouth 4 (four) times daily as needed. For pain         . OVER THE COUNTER MEDICATION   Oral   Take 3 tablets by mouth at bedtime as needed. For pain and sleep          BP 168/105  Pulse 84  Temp(Src) 97.8 F (36.6 C) (Oral)  Resp 20  SpO2 100%  LMP 05/22/2013 Physical Exam  Nursing note and vitals reviewed. Constitutional: She is oriented to person, place, and time. She appears well-developed and well-nourished. No distress.  HENT:  Head: Normocephalic and atraumatic.  Mouth/Throat: Oropharynx is clear and moist. No oropharyngeal exudate.  Poor dentition. Bilateral upper jaw teeth tender to percussion.   Eyes: Conjunctivae are normal.  Neck: Normal range of motion.  Cardiovascular: Normal rate and regular rhythm.  Exam reveals no gallop and no friction rub.   No murmur heard. Pulmonary/Chest: Effort normal and breath sounds normal. She has no wheezes. She has no  rales. She exhibits no tenderness.  Abdominal: Soft. There is no tenderness.  Musculoskeletal: Normal range of motion.  Neurological: She is alert and oriented to person, place, and time. Coordination normal.  Speech is goal-oriented. Moves limbs without ataxia.   Skin: Skin is warm and dry.  Psychiatric: She has a normal mood and affect. Her behavior is normal.    ED Course  Procedures (including critical care time) Labs Review Labs Reviewed - No data to display Imaging Review No results found.  EKG Interpretation   None       MDM   1. Pain, dental     7:32 PM Patient will have Tramadol and Penicillin VK for symptoms. Vitals stable and patient afebrile. Patient given resource guide for dentist follow up.     Emilia Beck, PA-C 06/01/13 1956

## 2013-08-25 ENCOUNTER — Emergency Department (HOSPITAL_COMMUNITY): Payer: Self-pay

## 2013-08-25 ENCOUNTER — Emergency Department (HOSPITAL_COMMUNITY)
Admission: EM | Admit: 2013-08-25 | Discharge: 2013-08-25 | Disposition: A | Discharge status: Home / Self Care | Payer: Self-pay | Attending: Emergency Medicine | Admitting: Emergency Medicine

## 2013-08-25 ENCOUNTER — Encounter (HOSPITAL_COMMUNITY): Payer: Self-pay | Admitting: Emergency Medicine

## 2013-08-25 DIAGNOSIS — R52 Pain, unspecified: Secondary | ICD-10-CM | POA: Insufficient documentation

## 2013-08-25 DIAGNOSIS — K089 Disorder of teeth and supporting structures, unspecified: Secondary | ICD-10-CM | POA: Insufficient documentation

## 2013-08-25 DIAGNOSIS — R509 Fever, unspecified: Secondary | ICD-10-CM | POA: Insufficient documentation

## 2013-08-25 DIAGNOSIS — I10 Essential (primary) hypertension: Secondary | ICD-10-CM | POA: Insufficient documentation

## 2013-08-25 DIAGNOSIS — R059 Cough, unspecified: Secondary | ICD-10-CM | POA: Insufficient documentation

## 2013-08-25 DIAGNOSIS — Z8719 Personal history of other diseases of the digestive system: Secondary | ICD-10-CM | POA: Insufficient documentation

## 2013-08-25 DIAGNOSIS — F3289 Other specified depressive episodes: Secondary | ICD-10-CM | POA: Insufficient documentation

## 2013-08-25 DIAGNOSIS — R110 Nausea: Secondary | ICD-10-CM | POA: Insufficient documentation

## 2013-08-25 DIAGNOSIS — Z792 Long term (current) use of antibiotics: Secondary | ICD-10-CM | POA: Insufficient documentation

## 2013-08-25 DIAGNOSIS — F17200 Nicotine dependence, unspecified, uncomplicated: Secondary | ICD-10-CM | POA: Insufficient documentation

## 2013-08-25 DIAGNOSIS — H9209 Otalgia, unspecified ear: Secondary | ICD-10-CM | POA: Insufficient documentation

## 2013-08-25 DIAGNOSIS — R51 Headache: Secondary | ICD-10-CM | POA: Insufficient documentation

## 2013-08-25 DIAGNOSIS — J438 Other emphysema: Secondary | ICD-10-CM | POA: Insufficient documentation

## 2013-08-25 DIAGNOSIS — F329 Major depressive disorder, single episode, unspecified: Secondary | ICD-10-CM | POA: Insufficient documentation

## 2013-08-25 DIAGNOSIS — K0889 Other specified disorders of teeth and supporting structures: Secondary | ICD-10-CM

## 2013-08-25 DIAGNOSIS — Z872 Personal history of diseases of the skin and subcutaneous tissue: Secondary | ICD-10-CM | POA: Insufficient documentation

## 2013-08-25 DIAGNOSIS — R5383 Other fatigue: Secondary | ICD-10-CM

## 2013-08-25 DIAGNOSIS — Z79899 Other long term (current) drug therapy: Secondary | ICD-10-CM | POA: Insufficient documentation

## 2013-08-25 DIAGNOSIS — R05 Cough: Secondary | ICD-10-CM

## 2013-08-25 DIAGNOSIS — R5381 Other malaise: Secondary | ICD-10-CM | POA: Insufficient documentation

## 2013-08-25 MED ORDER — TRAMADOL HCL 50 MG PO TABS
50.0000 mg | ORAL_TABLET | Freq: Once | ORAL | Status: AC
  Administered 2013-08-25: 50 mg via ORAL
  Filled 2013-08-25: qty 1

## 2013-08-25 MED ORDER — TRAMADOL HCL 50 MG PO TABS: 50.0000 mg | ORAL_TABLET | Freq: Four times a day (QID) | ORAL | Status: DC | PRN

## 2013-08-25 MED ORDER — AMOXICILLIN 500 MG PO CAPS: 500.0000 mg | ORAL_CAPSULE | Freq: Three times a day (TID) | ORAL | Status: AC

## 2013-08-25 NOTE — ED Provider Notes (Signed)
CSN: [id number]     Arrival date & time 08/25/13  1610 History  This chart was scribed for non-physician practitioner, Roxy Horsemanobert Thurmon Mizell, PA-C working with Candyce ChurnJohn David Wofford, MD by Greggory StallionKayla Andersen, ED scribe. This patient was seen in room TR06C/TR06C and the patient's care was started at 4:54 PM.   Chief Complaint  Patient presents with  . Nasal Congestion  . Dental Pain  . Weakness   The history is provided by the patient. No language interpreter was used.   HPI Comments: Tiffany Richmond is a 38 y.o. female who presents to the Emergency Department complaining of nasal congestion, sore throat, headache, mildly productive cough, subjective fever, chills, generalized body aches, left ear pain and nausea that started about 2 weeks ago. Pt has tried chicken broth but no medications. States she is also having dental pain. She states her tooth is cracked. Pt has taken BC powders and used Orajel with no relief. Denies emesis, diarrhea. Pt's husband was recently sick with the same.   Past Medical History  Diagnosis Date  . Substance abuse   . Depression   . Emphysema of lung   . Headache(784.0)   . GERD (gastroesophageal reflux disease)   . Ulcer   . Hypertension    Past Surgical History  Procedure Laterality Date  . Cholecystectomy    . Tubal ligation     Family History  Problem Relation Age of Onset  . Mental illness Mother   . Arthritis Maternal Grandmother   . Hyperlipidemia Maternal Grandmother   . Hypertension Maternal Grandmother    History  Substance Use Topics  . Smoking status: Current Every Day Smoker -- 0.50 packs/day    Types: Cigarettes  . Smokeless tobacco: Not on file  . Alcohol Use: Yes     Comment: very seldom   OB History   Grav Para Term Preterm Abortions TAB SAB Ect Mult Living                 Review of Systems A complete 10 system review of systems was obtained and all systems are negative except as noted in the HPI and PMH.   Allergies  Review of  patient's allergies indicates no known allergies.  Home Medications   Current Outpatient Rx  Name  Route  Sig  Dispense  Refill  . acetaminophen (TYLENOL) 325 MG tablet   Oral   Take 325-650 mg by mouth every 6 (six) hours as needed. For pain         . albuterol (PROVENTIL HFA;VENTOLIN HFA) 108 (90 BASE) MCG/ACT inhaler   Inhalation   Inhale 2 puffs into the lungs every 6 (six) hours as needed. For shortness of breath         . Aspirin-Acetaminophen-Caffeine (GOODY HEADACHE PO)   Oral   Take 1 packet by mouth 4 (four) times daily as needed. For pain         . penicillin v potassium (VEETID) 500 MG tablet   Oral   Take 1 tablet (500 mg total) by mouth 4 (four) times daily.   40 tablet   0   . traMADol (ULTRAM) 50 MG tablet   Oral   Take 1 tablet (50 mg total) by mouth every 6 (six) hours as needed for pain.   15 tablet   0    BP 147/106  Pulse 93  Temp(Src) 98.7 F (37.1 C) (Oral)  Resp 18  SpO2 98%  LMP 08/04/2013  Physical Exam  Nursing note and  vitals reviewed. Constitutional: She is oriented to person, place, and time. She appears well-developed and well-nourished. No distress.  HENT:  Head: Normocephalic and atraumatic.  Mild congestion seen behind bilateral TMs. Oropharynx is mildly erythematous. No tonsillar exudates.no signs of peritonsillar or tonsillar abscesses. Uvula midline. Airway intact. No evidence of Ludwig's angina. Upper right first premolar is broken. No evidence of gingival abscess.   Eyes: EOM are normal.  Neck: Neck supple. No tracheal deviation present.  Cardiovascular: Normal rate, regular rhythm and normal heart sounds.  Exam reveals no gallop and no friction rub.   No murmur heard. Pulmonary/Chest: Effort normal and breath sounds normal. No respiratory distress. She has no wheezes. She has no rales. She exhibits no tenderness.  Abdominal: She exhibits no distension.  Musculoskeletal: Normal range of motion.  Neurological: She is  alert and oriented to person, place, and time.  Skin: Skin is warm and dry.  Psychiatric: She has a normal mood and affect. Her behavior is normal.    ED Course  Procedures (including critical care time)  DIAGNOSTIC STUDIES: Oxygen Saturation is 98% on RA, normal by my interpretation.    COORDINATION OF CARE: 4:58 PM-Discussed treatment plan which includes a chest xray and an antibiotic with pt at bedside and pt agreed to plan. Advised pt to follow up with a dentist for her dental pain.   Labs Review Labs Reviewed - No data to display Imaging Review Dg Chest 2 View  08/25/2013   CLINICAL DATA:  Cough and congestion for 2 weeks, fever, headache  EXAM: CHEST  2 VIEW  COMPARISON:  10/16/2011; 11/27/2010  FINDINGS: Grossly unchanged cardiac silhouette and mediastinal contours. No focal airspace opacities. No pleural effusion or pneumothorax. No evidence of edema. Unchanged bones. Post cholecystectomy.  IMPRESSION: No acute cardiopulmonary disease. Specifically, no evidence of pneumonia.   Electronically Signed   By: Simonne Come M.D.   On: 08/25/2013 17:44    EKG Interpretation   None       MDM   1. Cough   2. Pain, dental    Pt CXR negative for acute infiltrate. Patients symptoms are consistent with URI, likely viral etiology. Discussed that antibiotics are not indicated for viral infections. Pt will be discharged with symptomatic treatment.  Verbalizes understanding and is agreeable with plan. Pt is hemodynamically stable & in NAD prior to dc.  Patient with toothache.  No gross abscess.  Exam unconcerning for Ludwig's angina or spread of infection.  Will treat with penicillin and pain medicine.  Urged patient to follow-up with dentist.    I personally performed the services described in this documentation, which was scribed in my presence. The recorded information has been reviewed and is accurate.    Roxy Horseman, PA-C 08/25/13 1940

## 2013-08-25 NOTE — Discharge Instructions (Signed)
Cough, Adult ° A cough is a reflex that helps clear your throat and airways. It can help heal the body or may be a reaction to an irritated airway. A cough may only last 2 or 3 weeks (acute) or may last more than 8 weeks (chronic).  °CAUSES °Acute cough: °· Viral or bacterial infections. °Chronic cough: °· Infections. °· Allergies. °· Asthma. °· Post-nasal drip. °· Smoking. °· Heartburn or acid reflux. °· Some medicines. °· Chronic lung problems (COPD). °· Cancer. °SYMPTOMS  °· Cough. °· Fever. °· Chest pain. °· Increased breathing rate. °· High-pitched whistling sound when breathing (wheezing). °· Colored mucus that you cough up (sputum). °TREATMENT  °· A bacterial cough may be treated with antibiotic medicine. °· A viral cough must run its course and will not respond to antibiotics. °· Your caregiver may recommend other treatments if you have a chronic cough. °HOME CARE INSTRUCTIONS  °· Only take over-the-counter or prescription medicines for pain, discomfort, or fever as directed by your caregiver. Use cough suppressants only as directed by your caregiver. °· Use a cold steam vaporizer or humidifier in your bedroom or home to help loosen secretions. °· Sleep in a semi-upright position if your cough is worse at night. °· Rest as needed. °· Stop smoking if you smoke. °SEEK IMMEDIATE MEDICAL CARE IF:  °· You have pus in your sputum. °· Your cough starts to worsen. °· You cannot control your cough with suppressants and are losing sleep. °· You begin coughing up blood. °· You have difficulty breathing. °· You develop pain which is getting worse or is uncontrolled with medicine. °· You have a fever. °MAKE SURE YOU:  °· Understand these instructions. °· Will watch your condition. °· Will get help right away if you are not doing well or get worse. °Document Released: 01/30/2011 Document Revised: 10/26/2011 Document Reviewed: 01/30/2011 °[employer]® Patient Information ©2014 [employer]. ° °Dental Pain °A tooth ache may  be caused by cavities (tooth decay). Cavities expose the nerve of the tooth to air and hot or cold temperatures. It may come from an infection or abscess (also called a boil or furuncle) around your tooth. It is also often caused by dental caries (tooth decay). This causes the pain you are having. °DIAGNOSIS  °Your caregiver can diagnose this problem by exam. °TREATMENT  °· If caused by an infection, it may be treated with medications which kill germs (antibiotics) and pain medications as prescribed by your caregiver. Take medications as directed. °· Only take over-the-counter or prescription medicines for pain, discomfort, or fever as directed by your caregiver. °· Whether the tooth ache today is caused by infection or dental disease, you should see your dentist as soon as possible for further care. °SEEK MEDICAL CARE IF: °The exam and treatment you received today has been provided on an emergency basis only. This is not a substitute for complete medical or dental care. If your problem worsens or new problems (symptoms) appear, and you are unable to meet with your dentist, call or return to this location. °SEEK IMMEDIATE MEDICAL CARE IF:  °· You have a fever. °· You develop redness and swelling of your face, jaw, or neck. °· You are unable to open your mouth. °· You have severe pain uncontrolled by pain medicine. °MAKE SURE YOU:  °· Understand these instructions. °· Will watch your condition. °· Will get help right away if you are not doing well or get worse. °Document Released: 08/03/2005 Document Revised: 10/26/2011 Document Reviewed: 03/21/2008 °  [employer]® Patient Information ©2014 [employer]. ° °

## 2013-08-25 NOTE — ED Notes (Signed)
Pt reports to the ED for eval of toothache, sore throat, HA, nasal congestion, dry non-productive cough, fevers, chills, generalized weakness, decreased PO intake, nausea, and body aches. Pt afebrile. Pt denies any active V/D. Broken tooth noted to right upper canine area. Pt reports she has been feeling this way x 2 weeks. Pt A&Ox4, resp e/u, and skin warm and dry.

## 2013-08-26 NOTE — ED Provider Notes (Signed)
Medical screening examination/treatment/procedure(s) were performed by non-physician practitioner and as supervising physician I was immediately available for consultation/collaboration.  EKG Interpretation   None         Candyce ChurnJohn David Katriel Cutsforth, MD 08/26/13 [id number]

## 2013-10-30 ENCOUNTER — Emergency Department (HOSPITAL_COMMUNITY)
Admission: EM | Admit: 2013-10-30 | Discharge: 2013-10-30 | Disposition: A | Discharge status: Home / Self Care | Payer: Self-pay | Attending: Emergency Medicine | Admitting: Emergency Medicine

## 2013-10-30 ENCOUNTER — Emergency Department (HOSPITAL_COMMUNITY): Payer: Self-pay

## 2013-10-30 ENCOUNTER — Encounter (HOSPITAL_COMMUNITY): Payer: Self-pay | Admitting: Emergency Medicine

## 2013-10-30 DIAGNOSIS — K0889 Other specified disorders of teeth and supporting structures: Secondary | ICD-10-CM

## 2013-10-30 DIAGNOSIS — R109 Unspecified abdominal pain: Secondary | ICD-10-CM | POA: Insufficient documentation

## 2013-10-30 DIAGNOSIS — R509 Fever, unspecified: Secondary | ICD-10-CM | POA: Insufficient documentation

## 2013-10-30 DIAGNOSIS — Z792 Long term (current) use of antibiotics: Secondary | ICD-10-CM | POA: Insufficient documentation

## 2013-10-30 DIAGNOSIS — H9209 Otalgia, unspecified ear: Secondary | ICD-10-CM | POA: Insufficient documentation

## 2013-10-30 DIAGNOSIS — Z872 Personal history of diseases of the skin and subcutaneous tissue: Secondary | ICD-10-CM | POA: Insufficient documentation

## 2013-10-30 DIAGNOSIS — R209 Unspecified disturbances of skin sensation: Secondary | ICD-10-CM | POA: Insufficient documentation

## 2013-10-30 DIAGNOSIS — R110 Nausea: Secondary | ICD-10-CM | POA: Insufficient documentation

## 2013-10-30 DIAGNOSIS — R079 Chest pain, unspecified: Secondary | ICD-10-CM | POA: Insufficient documentation

## 2013-10-30 DIAGNOSIS — F329 Major depressive disorder, single episode, unspecified: Secondary | ICD-10-CM | POA: Insufficient documentation

## 2013-10-30 DIAGNOSIS — J438 Other emphysema: Secondary | ICD-10-CM | POA: Insufficient documentation

## 2013-10-30 DIAGNOSIS — K089 Disorder of teeth and supporting structures, unspecified: Secondary | ICD-10-CM | POA: Insufficient documentation

## 2013-10-30 DIAGNOSIS — F3289 Other specified depressive episodes: Secondary | ICD-10-CM | POA: Insufficient documentation

## 2013-10-30 DIAGNOSIS — K047 Periapical abscess without sinus: Secondary | ICD-10-CM | POA: Insufficient documentation

## 2013-10-30 DIAGNOSIS — Z3202 Encounter for pregnancy test, result negative: Secondary | ICD-10-CM | POA: Insufficient documentation

## 2013-10-30 DIAGNOSIS — F17200 Nicotine dependence, unspecified, uncomplicated: Secondary | ICD-10-CM | POA: Insufficient documentation

## 2013-10-30 DIAGNOSIS — R0602 Shortness of breath: Secondary | ICD-10-CM | POA: Insufficient documentation

## 2013-10-30 DIAGNOSIS — M2560 Stiffness of unspecified joint, not elsewhere classified: Secondary | ICD-10-CM

## 2013-10-30 DIAGNOSIS — R51 Headache: Secondary | ICD-10-CM | POA: Insufficient documentation

## 2013-10-30 DIAGNOSIS — I10 Essential (primary) hypertension: Secondary | ICD-10-CM | POA: Insufficient documentation

## 2013-10-30 DIAGNOSIS — Z79899 Other long term (current) drug therapy: Secondary | ICD-10-CM | POA: Insufficient documentation

## 2013-10-30 DIAGNOSIS — R42 Dizziness and giddiness: Secondary | ICD-10-CM | POA: Insufficient documentation

## 2013-10-30 DIAGNOSIS — M2569 Stiffness of other specified joint, not elsewhere classified: Secondary | ICD-10-CM | POA: Insufficient documentation

## 2013-10-30 LAB — CBC WITH DIFFERENTIAL/PLATELET
BASOS PCT: 0 % (ref 0–1)
Basophils Absolute: 0 10*3/uL (ref 0.0–0.1)
EOS ABS: 0.1 10*3/uL (ref 0.0–0.7)
EOS PCT: 1 % (ref 0–5)
HCT: 42.3 % (ref 36.0–46.0)
Hemoglobin: 14.3 g/dL (ref 12.0–15.0)
LYMPHS ABS: 3.2 10*3/uL (ref 0.7–4.0)
Lymphocytes Relative: 33 % (ref 12–46)
MCH: 31.6 pg (ref 26.0–34.0)
MCHC: 33.8 g/dL (ref 30.0–36.0)
MCV: 93.6 fL (ref 78.0–100.0)
Monocytes Absolute: 0.6 10*3/uL (ref 0.1–1.0)
Monocytes Relative: 6 % (ref 3–12)
NEUTROS PCT: 60 % (ref 43–77)
Neutro Abs: 5.8 10*3/uL (ref 1.7–7.7)
Platelets: 337 10*3/uL (ref 150–400)
RBC: 4.52 MIL/uL (ref 3.87–5.11)
RDW: 13.1 % (ref 11.5–15.5)
WBC: 9.7 10*3/uL (ref 4.0–10.5)

## 2013-10-30 LAB — PREGNANCY, URINE: Preg Test, Ur: NEGATIVE

## 2013-10-30 LAB — COMPREHENSIVE METABOLIC PANEL
ALBUMIN: 3.7 g/dL (ref 3.5–5.2)
ALK PHOS: 63 U/L (ref 39–117)
ALT: 14 U/L (ref 0–35)
AST: 16 U/L (ref 0–37)
BUN: 8 mg/dL (ref 6–23)
CALCIUM: 9.1 mg/dL (ref 8.4–10.5)
CO2: 25 mEq/L (ref 19–32)
Chloride: 100 mEq/L (ref 96–112)
Creatinine, Ser: 0.59 mg/dL (ref 0.50–1.10)
GFR calc non Af Amer: >90 mL/min (ref >90)
GLUCOSE: 85 mg/dL (ref 70–99)
POTASSIUM: 3.7 meq/L (ref 3.7–5.3)
Sodium: 138 mEq/L (ref 137–147)
TOTAL PROTEIN: 6.9 g/dL (ref 6.0–8.3)
Total Bilirubin: 0.4 mg/dL (ref 0.3–1.2)

## 2013-10-30 LAB — TROPONIN I

## 2013-10-30 MED ORDER — PENICILLIN V POTASSIUM 500 MG PO TABS: 500.0000 mg | ORAL_TABLET | Freq: Four times a day (QID) | ORAL | Status: AC

## 2013-10-30 MED ORDER — TRAMADOL HCL 50 MG PO TABS
50.0000 mg | ORAL_TABLET | Freq: Once | ORAL | Status: AC
Start: 2013-10-30 — End: 2013-10-30
  Administered 2013-10-30: 50 mg via ORAL
  Filled 2013-10-30: qty 1

## 2013-10-30 MED ORDER — TRAMADOL HCL 50 MG PO TABS: 50.0000 mg | ORAL_TABLET | Freq: Two times a day (BID) | ORAL | Status: AC | PRN

## 2013-10-30 NOTE — ED Provider Notes (Signed)
CSN: [id number]     Arrival date & time 10/30/13  1633 History  This chart was scribed for non-physician practitioner Raymon Mutton, PA-C working with Gavin Pound. Oletta Lamas, MD by Donne Anon, ED Scribe. This patient was seen in room WTR8/WTR8 and the patient's care was started at 1718.    First MD Initiated Contact with Patient 10/30/13 1718     Chief Complaint  Patient presents with  . Dental Pain  . Facial Swelling     The history is provided by the patient. No language interpreter was used.   HPI Comments: Tiffany Richmond is a 38 y.o. female who presents to the Emergency Department complaining of 7 days of sudden onset, gradually worsening, severe, lower left sided dental pain that began after she chipped a tooth. She states that last pain suddenly began radiating up the left side of her face into her head. She describes this pain as pressure. She states she has had dental pain before, but facial swelling and HA have never accompanied the dental pain. She states 2 nights ago she felt a pulsing sensation from her head to her toes and experienced numbness during this episode. She did not have SOB during this episode. She reports that last night she had 1 episode of CP and SOB, which has since resolved. She also reports she woke up yesterday morning with 2 black eye (denies trauma) and associated right hand swelling that began today. She reports she has expereinced associated dizziness, subjective fever, chills, neck stiffness, nausea, decreased appetite, left ear pain, and abdominal pain within the past week. She denies blurred vision, vomiting, cough, nasal congestion, facial droop, short term memory loss, head trauma, drainage from the broken tooth or any other symptoms. She denies currently experiencing CP. She does not take any blood thinners.    Past Medical History  Diagnosis Date  . Substance abuse   . Depression   . Emphysema of lung   . Headache(784.0)   . GERD (gastroesophageal  reflux disease)   . Ulcer   . Hypertension    Past Surgical History  Procedure Laterality Date  . Cholecystectomy    . Tubal ligation     Family History  Problem Relation Age of Onset  . Mental illness Mother   . Arthritis Maternal Grandmother   . Hyperlipidemia Maternal Grandmother   . Hypertension Maternal Grandmother    History  Substance Use Topics  . Smoking status: Current Every Day Smoker -- 0.50 packs/day    Types: Cigarettes  . Smokeless tobacco: Not on file  . Alcohol Use: Yes     Comment: very seldom   OB History   Grav Para Term Preterm Abortions TAB SAB Ect Mult Living                 Review of Systems  Constitutional: Positive for fever and chills.  HENT: Positive for dental problem and ear pain. Negative for congestion.   Eyes: Negative for visual disturbance.  Respiratory: Positive for shortness of breath. Negative for cough.   Cardiovascular: Positive for chest pain.  Gastrointestinal: Positive for nausea and abdominal pain. Negative for vomiting.  Musculoskeletal: Positive for neck stiffness.  Neurological: Positive for dizziness, numbness and headaches. Negative for facial asymmetry, speech difficulty and light-headedness.  All other systems reviewed and are negative.      Allergies  Review of patient's allergies indicates no known allergies.  Home Medications   Current Outpatient Rx  Name  Route  Sig  Dispense  Refill  . Acetaminophen (STANBACK ASPIRIN FREE) 950 MG PACK   Oral   Take 1 each by mouth every 6 (six) hours as needed (pain).         Marland Kitchen amoxicillin (AMOXIL) 500 MG capsule   Oral   Take 1 capsule (500 mg total) by mouth 3 (three) times daily.   30 capsule   0   . Aspirin-Caffeine 845-65 MG PACK   Oral   Take 1 Package by mouth every 6 (six) hours as needed (pain).         Marland Kitchen ibuprofen (ADVIL,MOTRIN) 200 MG tablet   Oral   Take 400 mg by mouth every 6 (six) hours as needed.         Marland Kitchen albuterol (PROVENTIL  HFA;VENTOLIN HFA) 108 (90 BASE) MCG/ACT inhaler   Inhalation   Inhale 2 puffs into the lungs every 6 (six) hours as needed. For shortness of breath         . penicillin v potassium (VEETID) 500 MG tablet   Oral   Take 1 tablet (500 mg total) by mouth 4 (four) times daily.   40 tablet   0   . traMADol (ULTRAM) 50 MG tablet   Oral   Take 1 tablet (50 mg total) by mouth every 12 (twelve) hours as needed.   9 tablet   0    BP 148/94  Pulse 74  Temp(Src) 97.8 F (36.6 C) (Oral)  Resp 15  SpO2 100%  LMP 10/23/2013  Physical Exam  Nursing note and vitals reviewed. Constitutional: She is oriented to person, place, and time. She appears well-developed and well-nourished. No distress.  HENT:  Head: Normocephalic and atraumatic.  Mouth/Throat: Oropharynx is clear and moist. No oropharyngeal exudate.  Swelling localized to the right side of the face with negative erythema, ecchymosis, lesions, sores. Poor dentition identified with numerous teeth missing-remaining teeth decaying diagrammed. Decay and diagrammed first premolar of right maxillary jawline - negative active bleeding or drainage noted. Negative trismus. Uvula midline with symmetrical elevation. Negative uvula swelling. Negative sublingual lesions.  Eyes: Conjunctivae and EOM are normal. Pupils are equal, round, and reactive to light. Right eye exhibits no discharge. Left eye exhibits no discharge.  Negative nystagmus Visual fields grossly intact  Neck: Normal range of motion. Neck supple. No tracheal deviation present.  Mild tonsillar adenopathy identified bilaterally-soft upon palpation, mobile. Negative neck stiffness Negative nuchal rigidity  Cardiovascular: Normal rate, regular rhythm and normal heart sounds.   Pulses:      Radial pulses are 2+ on the right side, and 2+ on the left side.  Pulmonary/Chest: Effort normal and breath sounds normal. No respiratory distress. She has no wheezes. She has no rales. She exhibits  no tenderness.  Negative stridor Negative use of excess her muscles Patient stated to speak in full sentences without difficulty  Abdominal: Soft. Bowel sounds are normal. There is no tenderness. There is no guarding.  Musculoskeletal: Normal range of motion.  Full ROM to upper and lower extremities without difficulty noted, negative ataxia noted.  Lymphadenopathy:    She has cervical adenopathy.  Neurological: She is alert and oriented to person, place, and time. No cranial nerve deficit. She exhibits normal muscle tone. Coordination normal.  Cranial nerves III-XII grossly intact Strength 5+/5+ to upper and lower extremities bilaterally with resistance applied, equal distribution noted Patient is able to bring finger to nose bilaterally without difficulty or ataxia  Skin: Skin is warm and dry.  Psychiatric: She has a normal  mood and affect. Her behavior is normal.    ED Course  Procedures (including critical care time) DIAGNOSTIC STUDIES: Oxygen Saturation is 100% on RA, normal by my interpretation.    COORDINATION OF CARE: 6:46 PM Discussed treatment plan which includes CXR, urine pregnancy test, CBC with differential, CMP, troponin level and EKG with pt at bedside and pt agreed to plan.   Results for orders placed during the hospital encounter of 10/30/13  CBC WITH DIFFERENTIAL      Result Value Ref Range   WBC 9.7  4.0 - 10.5 K/uL   RBC 4.52  3.87 - 5.11 MIL/uL   Hemoglobin 14.3  12.0 - 15.0 g/dL   HCT 81.1  91.4 - 78.2 %   MCV 93.6  78.0 - 100.0 fL   MCH 31.6  26.0 - 34.0 pg   MCHC 33.8  30.0 - 36.0 g/dL   RDW 95.6  21.3 - 08.6 %   Platelets 337  150 - 400 K/uL   Neutrophils Relative % 60  43 - 77 %   Neutro Abs 5.8  1.7 - 7.7 K/uL   Lymphocytes Relative 33  12 - 46 %   Lymphs Abs 3.2  0.7 - 4.0 K/uL   Monocytes Relative 6  3 - 12 %   Monocytes Absolute 0.6  0.1 - 1.0 K/uL   Eosinophils Relative 1  0 - 5 %   Eosinophils Absolute 0.1  0.0 - 0.7 K/uL   Basophils  Relative 0  0 - 1 %   Basophils Absolute 0.0  0.0 - 0.1 K/uL  COMPREHENSIVE METABOLIC PANEL      Result Value Ref Range   Sodium 138  137 - 147 mEq/L   Potassium 3.7  3.7 - 5.3 mEq/L   Chloride 100  96 - 112 mEq/L   CO2 25  19 - 32 mEq/L   Glucose, Bld 85  70 - 99 mg/dL   BUN 8  6 - 23 mg/dL   Creatinine, Ser 5.78  0.50 - 1.10 mg/dL   Calcium 9.1  8.4 - 46.9 mg/dL   Total Protein 6.9  6.0 - 8.3 g/dL   Albumin 3.7  3.5 - 5.2 g/dL   AST 16  0 - 37 U/L   ALT 14  0 - 35 U/L   Alkaline Phosphatase 63  39 - 117 U/L   Total Bilirubin 0.4  0.3 - 1.2 mg/dL   GFR calc non Af Amer >90  >90 mL/min   GFR calc Af Amer >90  >90 mL/min  TROPONIN I      Result Value Ref Range   Troponin I <0.30  <0.30 ng/mL  PREGNANCY, URINE      Result Value Ref Range   Preg Test, Ur NEGATIVE  NEGATIVE     Labs Review Labs Reviewed  CBC WITH DIFFERENTIAL  COMPREHENSIVE METABOLIC PANEL  TROPONIN I  PREGNANCY, URINE   Imaging Review Dg Chest 2 View  10/30/2013   CLINICAL DATA:  Facial swelling. Dizziness. Nausea. Fever. Chills.  EXAM: CHEST  2 VIEW  COMPARISON:  DG CHEST 2 VIEW dated 08/25/2013  FINDINGS: The heart size and mediastinal contours are within normal limits. Both lungs are clear. The visualized skeletal structures are unremarkable.  IMPRESSION: No active cardiopulmonary disease.   Electronically Signed   By: Herbie Baltimore M.D.   On: 10/30/2013 19:41   Ct Head Wo Contrast  10/30/2013   CLINICAL DATA:  Seven days of worsening left-sided dental pain, facial  pain and pressure, facial swelling, headache, spasms to top of head, history hypertension, emphysema  EXAM: CT HEAD WITHOUT CONTRAST  TECHNIQUE: Contiguous axial images were obtained from the base of the skull through the vertex without intravenous contrast.  COMPARISON:  None  FINDINGS: Generalized atrophy.  Normal ventricular morphology.  No midline shift or mass effect.  Normal appearance of brain parenchyma.  No intracranial hemorrhage, mass  lesion or evidence acute infarction.  No extra-axial fluid collections.  Bones and sinuses unremarkable.  IMPRESSION: No acute intracranial abnormalities.   Electronically Signed   By: Ulyses SouthwardMark  Boles M.D.   On: 10/30/2013 19:46     EKG Interpretation None      Date: 10/30/2013  Rate: 71  Rhythm: normal sinus rhythm  QRS Axis: normal  Intervals: PR shortened  ST/T Wave abnormalities: normal  Conduction Disutrbances:none  Narrative Interpretation:   Old EKG Reviewed: none available EKG analyzed and reviewed by this provider and attending physician.   MDM   Final diagnoses:  Pain, dental  Periapical abscess    I personally performed the services described in this documentation, which was scribed in my presence. The recorded information has been reviewed and is accurate.  Filed Vitals:   10/30/13 1719 10/30/13 1723 10/30/13 2000  BP:  161/96 148/94  Pulse:  84 74  Temp: 97.8 F (36.6 C) 97.8 F (36.6 C)   TempSrc: Oral Oral   Resp:  16 15  SpO2:  100% 100%   Patient presenting to the ED with swelling and dental pain that has been ongoing for the past week localized to the right upper maxillary jawline. Patient reported that she has been having a headache described as a throbbing sensation - stated that she has been feeling "loopy." Patient reported that yesterday morning when she woke up she noticed that her eye were black, stated that thy have improved since then. Stated that she has been having swelling to the right upper extremity that started today. Patient concerned because brain aneurysm runs in her family and is concerned that she has something going on in her head.  Alert and oriented. GCS 15. Heart rate and rhythm normal. Lungs clear to auscultation to upper and lower lobes bilaterally. Negative pain upon palpation to the chest wall. Radial pulses 2+ bilaterally. Mild facial swelling localized to the right side of the face, mainly upper jawline with negative signs of  cellulitis. Poor dentition noted with numerous teeth missing - remaining teeth decayed and diagrammed. Diagrammed, decaying right maxillary first premolar with mild discomfort upon palpation. Negative active drainage or bleeding noted. Negative trismus. Negative uvula swelling - uvula midline, symmetrical elevation. Negative sublingual lesions. Negative masses palpated to the neck. Neck supple with FROM. Full ROM to upper and lower extremities bilaterally without difficulty or ataxia. Strength intact. Gait proper with negative sway or step offs. EKG noted normal sinus rhythm with heart rate of 71 bpm - negative ischemic findings noted. Troponin negative elevation. CBC negative elevation of WBC - negative left shift or leukocytes. CMP negative findings. Urine pregnancy negative. CT head negative acute intracranial abnormalities. Chest xray negative acute cardiopulmonary disease noted.  Doubt posterior vertebral infarct. Doubt peritonsillar abscess. Doubt Ludwig's angina. Doubt retropharyngeal abscess. Negative well's criteria for PE - doubt PE. Suspicion to be periapical abscess secondary to tooth infection. This provider reviewed the patient's chart - patient has been seen numerous times regarding dental pain. Discussed case with attending who agreed with plan for discharge. Patient stable, afebrile. Discharged patient with  antibiotics and pain medications. Discussed with patient to rest and stay hydrated. Referred patient to [facility] wellness center and dentist. Discussed with patient regarding free dental clinic. Discussed with patient to closely monitor symptoms and if symptoms are to worsen or change to report back to the ED - strict return instructions given.  Patient agreed to plan of care, understood, all questions answered.   Raymon Mutton, PA-C 10/30/13 2121  Raymon Mutton, PA-C 10/30/13 2125  Raymon Mutton, PA-C 10/30/13 2142

## 2013-10-30 NOTE — ED Provider Notes (Signed)
Medical screening examination/treatment/procedure(s) were performed by non-physician practitioner and as supervising physician I was immediately available for consultation/collaboration.   EKG Interpretation   Date/Time:  Monday October 30 2013 19:42:05 EDT Ventricular Rate:  71 PR Interval:  102 QRS Duration: 85 QT Interval:  379 QTC Calculation: 412 R Axis:   64 Text Interpretation:  Sinus rhythm Short PR interval Otherwise within  normal limits Confirmed by [facility] HospitalGHIM  MD, MICHEAL ([id number]) on 10/30/2013 11:38:48  PM        Gavin PoundMichael Y. Oletta LamasGhim, MD 10/30/13 [phone]

## 2013-10-30 NOTE — Discharge Instructions (Signed)
Please rest and stay hydrated Please take medications as prescribed Please take pain medications as prescribed - while on pain medications there is to be no drinking alcohol, driving, operating any heavy machinery.  Please rest and stay hydrated Please continue to monitor symptoms closely and if symptoms are to worsen or change (fever greater than 101, chills, sweating, nausea, vomiting, diarrhea, neck pain, neck stiffness, inability to swallow, blurred vision, dizziness, chest pain, shortness of breath, difficulty breathing, throat closing sensation) please report back to the ED   Dental Abscess A dental abscess is a collection of infected fluid (pus) from a bacterial infection in the inner part of the tooth (pulp). It usually occurs at the end of the tooth's root.  CAUSES   Severe tooth decay.  Trauma to the tooth that allows bacteria to enter into the pulp, such as a broken or chipped tooth. SYMPTOMS   Severe pain in and around the infected tooth.  Swelling and redness around the abscessed tooth or in the mouth or face.  Tenderness.  Pus drainage.  Bad breath.  Bitter taste in the mouth.  Difficulty swallowing.  Difficulty opening the mouth.  Nausea.  Vomiting.  Chills.  Swollen neck glands. DIAGNOSIS   A medical and dental history will be taken.  An examination will be performed by tapping on the abscessed tooth.  X-rays may be taken of the tooth to identify the abscess. TREATMENT The goal of treatment is to eliminate the infection. You may be prescribed antibiotic medicine to stop the infection from spreading. A root canal may be performed to save the tooth. If the tooth cannot be saved, it may be pulled (extracted) and the abscess may be drained.  HOME CARE INSTRUCTIONS  Only take over-the-counter or prescription medicines for pain, fever, or discomfort as directed by your caregiver.  Rinse your mouth (gargle) often with salt water ( tsp salt in 8 oz [250  ml] of warm water) to relieve pain or swelling.  Do not drive after taking pain medicine (narcotics).  Do not apply heat to the outside of your face.  Return to your dentist for further treatment as directed. SEEK MEDICAL CARE IF:  Your pain is not helped by medicine.  Your pain is getting worse instead of better. SEEK IMMEDIATE MEDICAL CARE IF:  You have a fever or persistent symptoms for more than 2 3 days.  You have a fever and your symptoms suddenly get worse.  You have chills or a very bad headache.  You have problems breathing or swallowing.  You have trouble opening your mouth.  You have swelling in the neck or around the eye. Document Released: 08/03/2005 Document Revised: 04/27/2012 Document Reviewed: 11/11/2010 [facility] Patient Information 2014 [place], [place].   Emergency Department Resource Guide 1) Find a Doctor and Pay Out of Pocket Although you won't have to find out who is covered by your insurance plan, it is a good idea to ask around and get recommendations. You will then need to call the office and see if the doctor you have chosen will accept you as a new patient and what types of options they offer for patients who are self-pay. Some doctors offer discounts or will set up payment plans for their patients who do not have insurance, but you will need to ask so you aren't surprised when you get to your appointment.  2) Contact Your Local Health Department Not all health departments have doctors that can see patients for sick visits, but many  do, so it is worth a call to see if yours does. If you don't know where your local health department is, you can check in your phone book. The CDC also has a tool to help you locate your state's health department, and many state websites also have listings of all of their local health departments.  3) Find a Walk-in Clinic If your illness is not likely to be very severe or complicated, you may want to try a walk in clinic.  These are popping up all over the country in pharmacies, drugstores, and shopping centers. They're usually staffed by nurse practitioners or physician assistants that have been trained to treat common illnesses and complaints. They're usually fairly quick and inexpensive. However, if you have serious medical issues or chronic medical problems, these are probably not your best option.  No Primary Care Doctor: - Call Health Connect at  [phone] - they can help you locate a primary care doctor that  accepts your insurance, provides certain services, etc. - Physician Referral Service- [phone]  Chronic Pain Problems: Organization         Address  Phone   Notes  [facility]  [phone] Patients need to be referred by their primary care doctor.   Medication Assistance: Organization         Address  Phone   Notes  [facility] Medication [facility] [address] [place] [place], [place] [address] [phone] --Must be a resident of [facility] -- Must have NO insurance coverage whatsoever (no Medicaid/ Medicare, etc.) -- The pt. MUST have a primary care doctor that directs their care regularly and follows them in the community   [facility]ist  [phone]   [employer]  [phone]    Agencies that provide inexpensive medical care: Organization         Address  Phone   Notes  [facility]  [phone]   [facility]    [phone]   [facility] [address] [place], [place] [address] [phone]   [facility] [address] [place], [place] [phone]   Planned Parenthood    [phone]   [facility]    [phone]   Community [facility] and [facility]  [address], [place] Phone:  [phone], Fax:  [phone] Hours of Operation:  9 am - 6 pm, M-F.  Also accepts Medicaid/Medicare and self-pay.  [facility] for  Children  [address], [place] Phone: [phone], Fax: [phone]. Hours of Operation:  8:30 am - 5:30 pm, M-F.  Also accepts Medicaid and self-pay.  [facility] [place] [address] [place] Phone: [phone]   [facility] [address] Natasha Bence [place], [place] [phone], Ext. [phone] Mondays & Thursdays: 7-9 AM.  First 15 patients are seen on a first come, first serve basis.    Medicaid-accepting [facility] Providers:  Organization         Address  Phone   Notes  [facility] [address], [place] [phone] Also accepts self-pay patients.  [facility] [address] Laurell Josephs [place], [place]  [phone]   [facility] [address], [place] [phone]   [facility] [address] [place] [phone]   Renaye Rakers  [address], [place]   [phone] Only accepts [place] Access Medicaid patients after they have their name applied to their card.   Self-Pay (no insurance) in [facility]:  Organization         Address  Phone   Notes  Sickle Cell Patients, [facility] [address], [place] [phone]) [phone]   [facility] [address], [place] [phone]) [phone]   [facility] [place]  [address], [place] [phone]   [facility]/Dr. Osei-Bonsu  [address] [place] [address], [place] or [id number], [place] [phone]) [phone] Phone number for both [place] and [place] locations is the same.  [facility] [address], [place] [phone]   [facility] [place] [address] [place] [address], [place] or [address] [phone]   [facility] [address], [place] [phone](336) [phone], phone; [phone](336) [phone], fax Sees patients 1st  and 3rd Saturday of every month.  Must not qualify for public or private insurance (i.e. Medicaid, Medicare, [address] Health Choice, Veterans' Benefits)  Household income should be no more than 200% of the poverty level The clinic cannot treat you if you are pregnant or think you are pregnant  Sexually transmitted diseases are not treated at the clinic.    Dental Care: Organization         Address  Phone  Notes  [facility] [address], [place] [phone] Accepts children up to age 38 who are enrolled in [place] or [place] Health Choice; pregnant women with a Medicaid card; and children who have applied for Medicaid or [place] Health Choice, but were declined, whose parents can pay a reduced fee at time of service.  [facility] [place]  [address], [place] [phone] Accepts children up to age 38 who are enrolled in [place] or [place] Health Choice; pregnant women with a Medicaid card; and children who have applied for Medicaid or [place] Health Choice, but were declined, whose parents can pay a reduced fee at time of service.  [facility]  [address], [place] [phone]) [phone] Patients are seen by appointment only. Walk-ins are not accepted. [facility] will see patients 38 years of age and older. Monday - Tuesday (8am-5pm) Most Wednesdays (8:30-5pm) $30 per visit, cash only  [facility]  [address], [facility] [place] [phone]) [phone] Patients are seen by appointment only. Walk-ins are not accepted. [facility] will see patients 38 years of age and older. One Wednesday Evening (Monthly: Volunteer Based).  $30 per visit, cash only  [employer] [facility]  [phone] for adults; Children under age 334, call Graduate Pediatric Dentistry at [phone]. Children aged 324-14, please call [phone] to request a pediatric  application.  Dental services are provided in all areas of dental care including fillings, crowns and bridges, complete and partial dentures, implants, gum treatment, root canals, and extractions. Preventive care is also provided. Treatment is provided to both adults and children. Patients are selected via a lottery and there is often a waiting list.   [facility] [address], [place]  [phone] [email or web address]   [facility] [address], [place], [place] [phone], Ext. [phone] Second  and Fourth Thursday of each month, opens at 6:30 AM; Clinic ends at 9 AM.  Patients are seen on a first-come first-served basis, and a limited number are seen during each clinic.   [facility]  [address] Ether Griffins [place], [place] [phone]   Eligibility Requirements You must have lived in [place], [place], or [place] counties for at least the last three months.   You cannot be eligible for state or federal sponsored [place], including [employer], [place], or [employer].   You generally cannot be eligible for healthcare insurance through your employer.    How to apply: Eligibility screenings are held every Tuesday and Wednesday afternoon from 1:00 pm until 4:00 pm. You do not need an appointment for the interview!  [facility] [place] [facility] [address], [place], [place] [phone]   [facility] Health Department  [phone]   [facility] Health Department  [phone]   [facility] Health Department  [phone]    Behavioral Health Resources in the Community: Intensive Outpatient Programs Organization         Address  Phone  Notes  [facility] [address] [place], [place] [phone]   [facility] [address] [place], [place] [phone]   [facility] [address] [place], [place]  [phone]   [facility] Mental Health  [address],  [place], [place] [phone] or [phone]   Substance Abuse Resources Organization         Address  Phone  Notes  [facility] Drug [facility]  [phone]   [facility]  [phone]   [facility] [place]  [phone]   Floydene Flock  [phone]   Residential & Outpatient Substance Abuse Program  [phone]   Psychological Services Organization         Address  Phone  Notes  [facility] Behavioral Health  [phone]   [facility] Services  [phone]   [facility] [address] [place] [phone] or [phone]    Mobile Crisis Teams Organization         Address  Phone  Notes  [facility], Mobile Crisis Care Unit  [phone]   [facility]  [address] [place], [place] [phone]   Doristine Locks [address] [place] [phone]    Self-Help/Support Groups Organization         Address  Phone             Notes  [facility] [place] - variety of support groups  [phone]- I7437963 Call for more information  Narcotics Anonymous (NA), [facility] [address], [employer] [place]  2 meetings at this location   Statistician         Address  Phone  Notes  [facility] [address],    [place]  [phone]   [facility]  [address] [place] [address], [place], [place] [phone]   [facility] [address] [place], [place] [phone] Admissions: 8am-3pm M-F  [facility] [address]    [place], [place] [phone]   [facility] [address] Starling Manns [place], [place] [phone]   [facility] [address]  [place], [place] [phone]   [facility] - Intensive Outpatient [address] Laurell Josephs [address], [place], [place] [phone]   [facility] ([facility] [phone] [address]  Vanessa [place]    [place], [place] [phone] or [phone]   [facility] ([facility]) [address], [place], [place] [phone] Accepts Medicaid  [facility] [place] [address]  [place] [phone] Substance Abuse/Addiction Treatment   [facility] Organization         Address  Phone  Notes  [facility]  [phone]   Angie Fava, PhD [address] Ervin Knack [place], [place]   [phone] or [phone]   [facility] Behavioral   [address] [place], [place] [phone]   [facility] [address] [place], [place] [phone] Insurance/Medicaid/sponsorship through [facility] and Families [address]                                    [place], [place] [phone] Therapy/tele-psych/case  [facility] [address], [place] [phone]    Dr. Lolly Mustache  [phone]   [facility]  [employer] [facility] Dept. 1) [address], [place] 2) [address] [place] 3)  [address], [place] [phone]  [phone]   [facility] Child Abuse Hotline 650-267-0901 or [phone] (After Hours)

## 2013-10-30 NOTE — ED Notes (Addendum)
Pt. States that she has pressure in face, ears, head, neck due to dental pain since 10/23/08. Pt also stated she woke up 10/29/13 with black eyes. Pt stated she chipped a L lower tooth about a week ago and that is when all the symptoms started.

## 2015-04-12 IMAGING — CT CT HEAD W/O CM
2 series · 17 of 30 positions shown, 20 images · non-contrast
Comparison: None

CLINICAL DATA: Seven days of worsening left-sided dental pain,
facial pain and pressure, facial swelling, headache, spasms to top
of head, history hypertension, emphysema

EXAM:
CT HEAD WITHOUT CONTRAST
TECHNIQUE: Contiguous axial images were obtained from the base of the skull
through the vertex without intravenous contrast.

[Series 2: head w/o · axial · non-contrast · 0.48mm/px · z∈[-124,-14]mm · 9 of 28 slices shown, 12 images]
[im 3/28  brain]
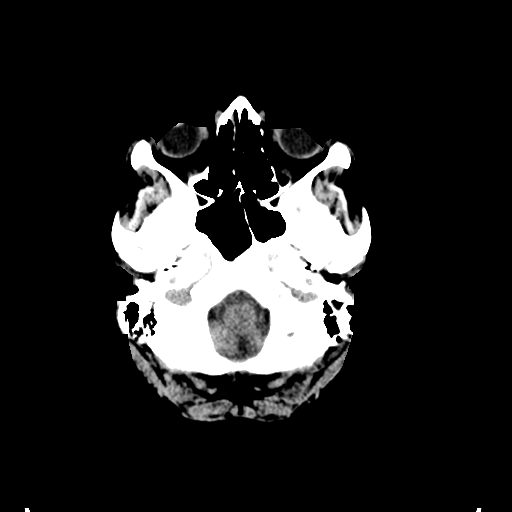
[im 3/28  bone]
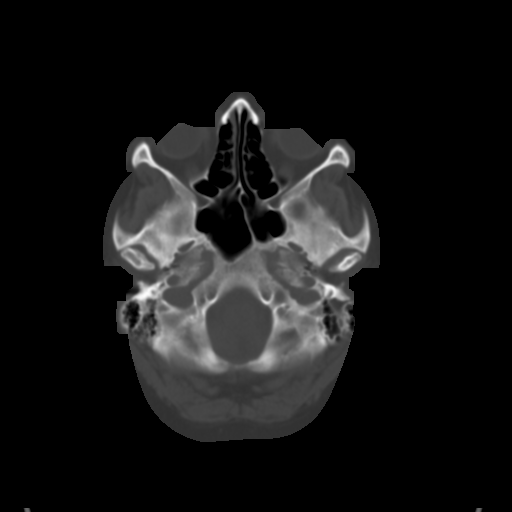
[im 6/28  brain]
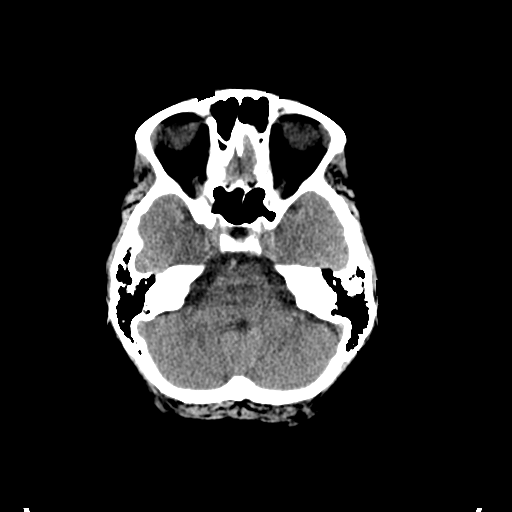
[im 9/28  brain]
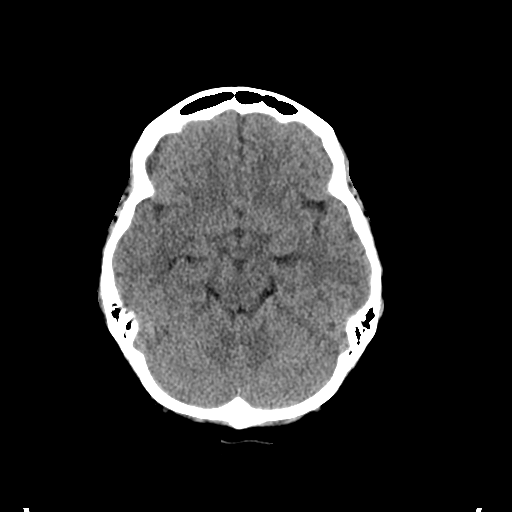
[im 11/28  brain]
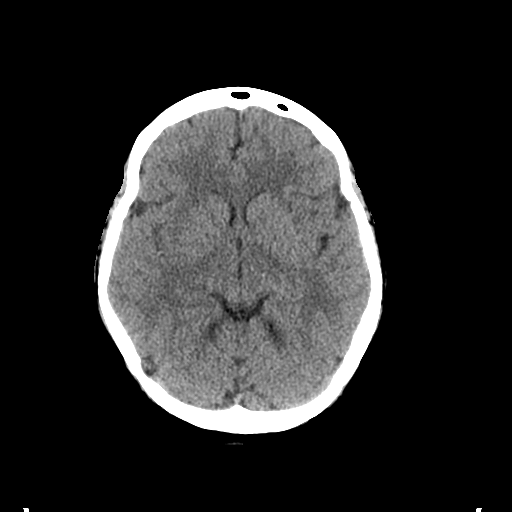
[im 14/28  brain]
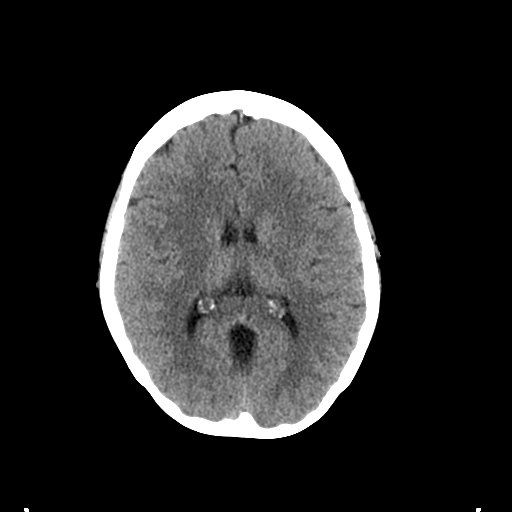
[im 14/28  bone]
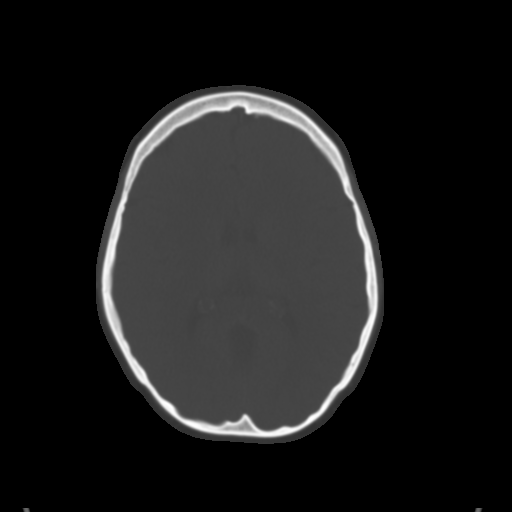
[im 17/28  brain]
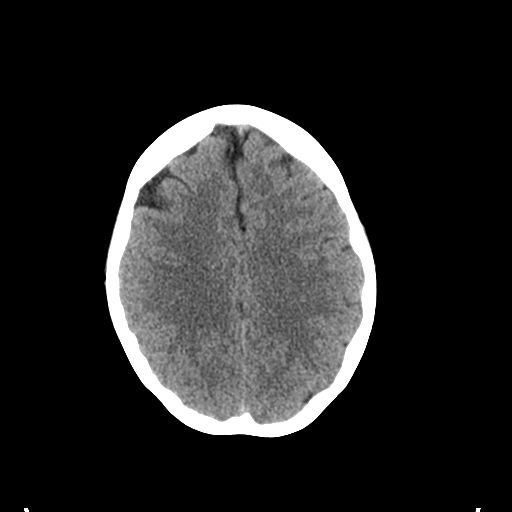
[im 19/28  brain]
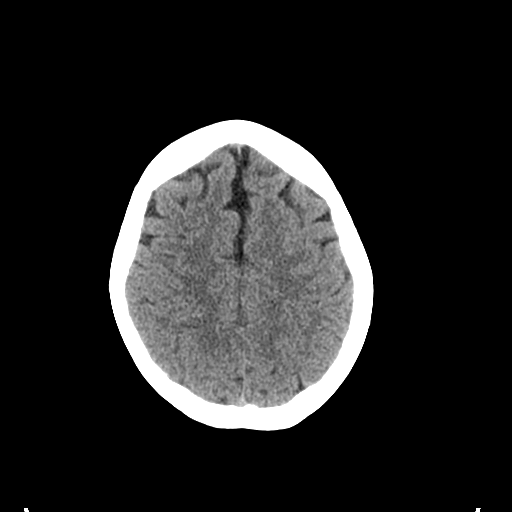
[im 22/28  brain]
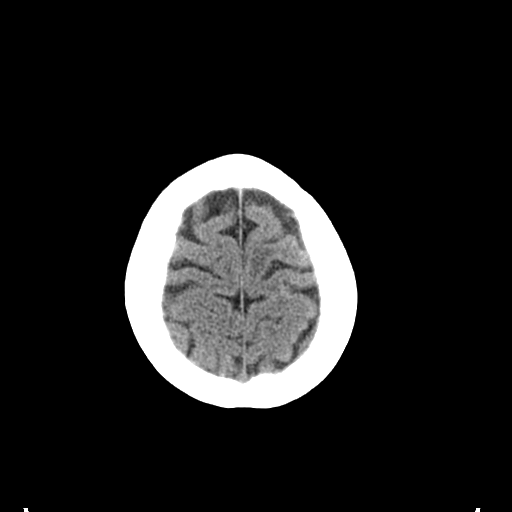
[im 25/28  brain]
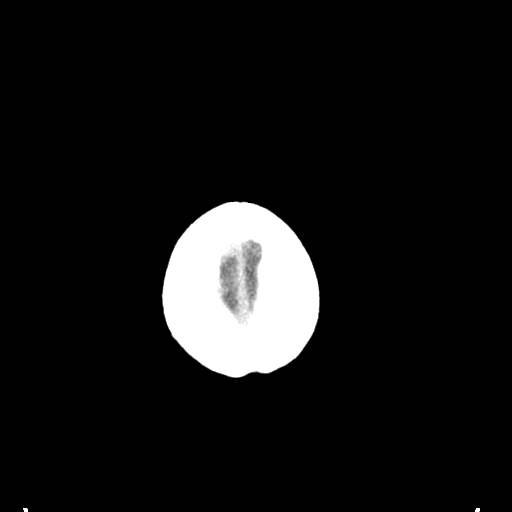
[im 25/28  bone]
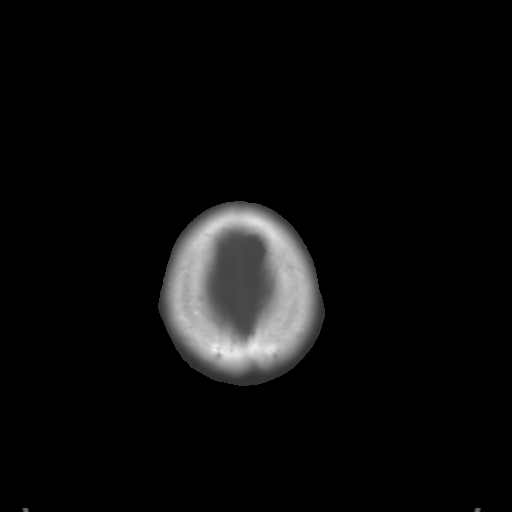

[Series 3: bone windows · axial · 0.48mm/px · z∈[-119,-14]mm · 8 of 46 slices shown]
[im 6/46  bone]
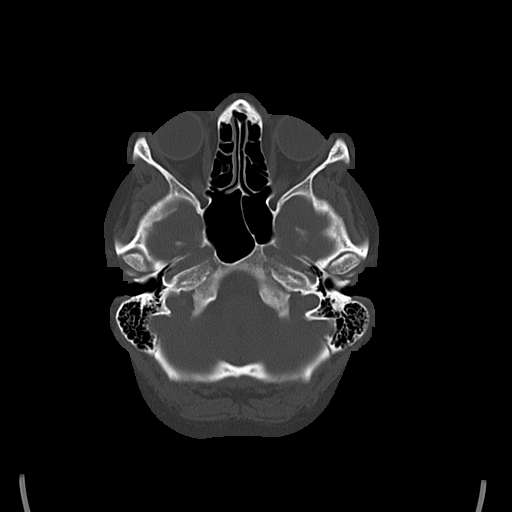
[im 11/46  bone]
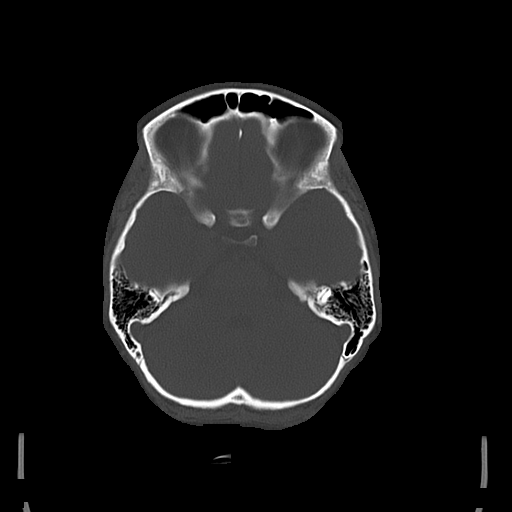
[im 16/46  bone]
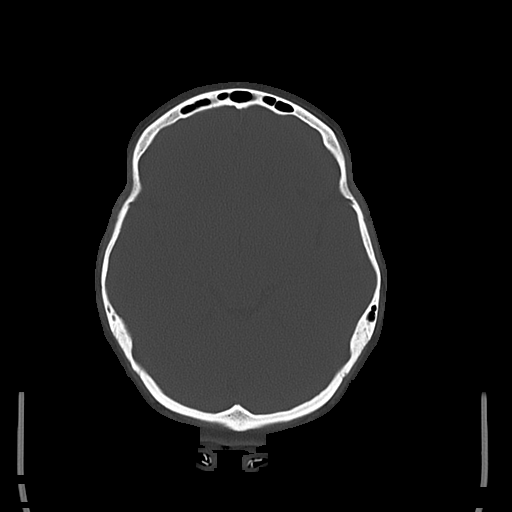
[im 21/46  bone]
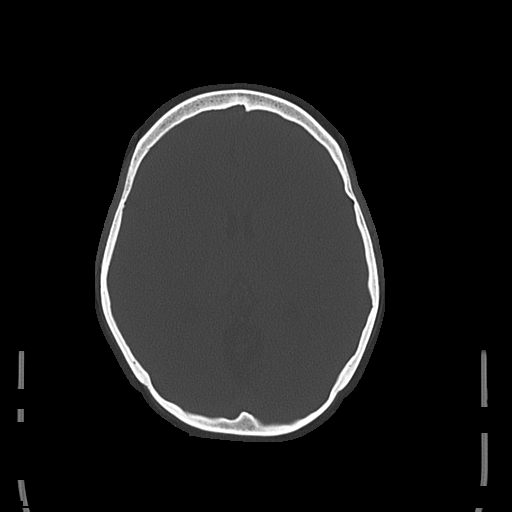
[im 26/46  bone]
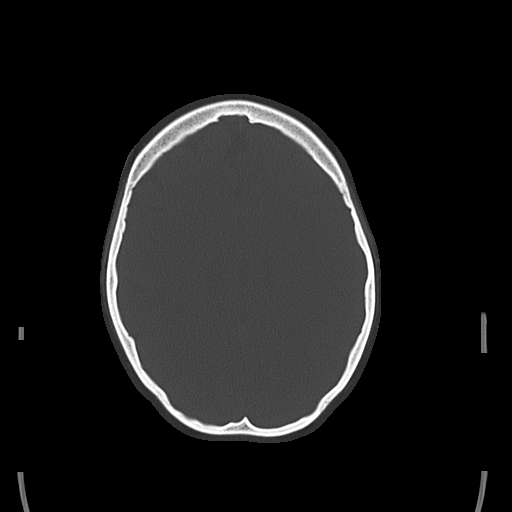
[im 31/46  bone]
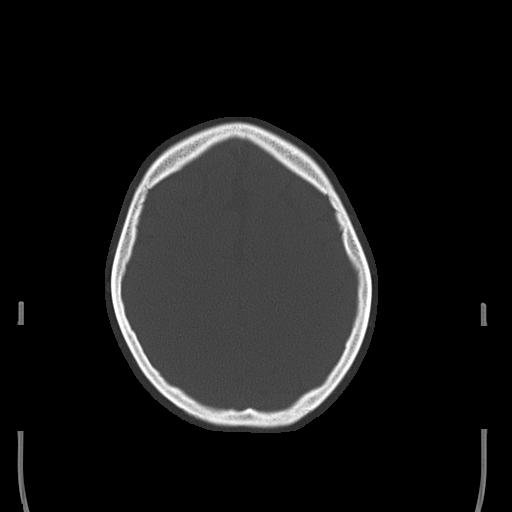
[im 36/46  bone]
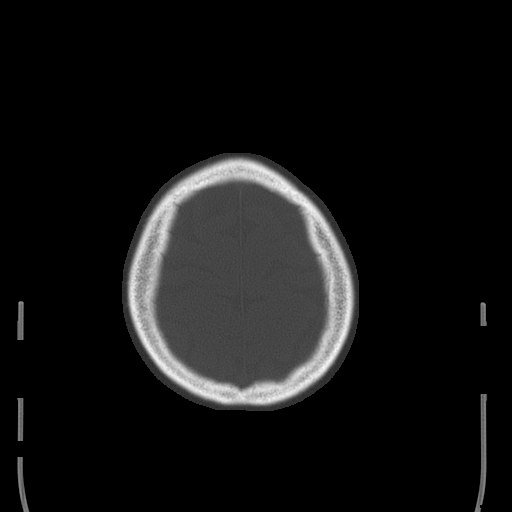
[im 41/46  bone]
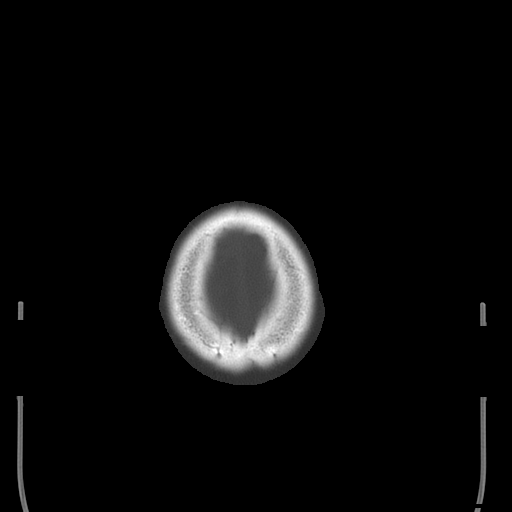

[17 of 30 positions shown; findings below may reference images not displayed]

FINDINGS: Generalized atrophy.

Normal ventricular morphology.

No midline shift or mass effect.

Normal appearance of brain parenchyma.

No intracranial hemorrhage, mass lesion or evidence acute
infarction.

No extra-axial fluid collections.

Bones and sinuses unremarkable.
IMPRESSION: No acute intracranial abnormalities.
